# Patient Record
Sex: Female | Born: 1935 | Race: White | Hispanic: No | Marital: Married | Smoking: Never smoker
Health system: Southern US, Community
[De-identification: ages and names within clinical notes are randomized; demographics above are authoritative.]

## PROBLEM LIST (undated history)

## (undated) DIAGNOSIS — I6529 Occlusion and stenosis of unspecified carotid artery: Secondary | ICD-10-CM

## (undated) DIAGNOSIS — I4891 Unspecified atrial fibrillation: Secondary | ICD-10-CM

## (undated) DIAGNOSIS — I10 Essential (primary) hypertension: Secondary | ICD-10-CM

## (undated) DIAGNOSIS — I639 Cerebral infarction, unspecified: Secondary | ICD-10-CM

## (undated) DIAGNOSIS — E079 Disorder of thyroid, unspecified: Secondary | ICD-10-CM

## (undated) DIAGNOSIS — E785 Hyperlipidemia, unspecified: Secondary | ICD-10-CM

## (undated) HISTORY — PX: CATARACT EXTRACTION, BILATERAL: SHX1313

## (undated) HISTORY — PX: APPENDECTOMY: SHX54

## (undated) HISTORY — PX: TUBAL LIGATION: SHX77

## (undated) HISTORY — PX: OTHER SURGICAL HISTORY: SHX169

## (undated) HISTORY — DX: Occlusion and stenosis of unspecified carotid artery: I65.29

## (undated) HISTORY — DX: Cerebral infarction, unspecified: I63.9

## (undated) HISTORY — DX: Unspecified atrial fibrillation: I48.91

## (undated) HISTORY — PX: ABDOMINAL HYSTERECTOMY: SHX81

## (undated) HISTORY — DX: Hyperlipidemia, unspecified: E78.5

## (undated) HISTORY — PX: CARPAL TUNNEL RELEASE: SHX101

## (undated) HISTORY — DX: Disorder of thyroid, unspecified: E07.9

---

## 1999-05-19 ENCOUNTER — Encounter: Payer: Self-pay | Admitting: Gynecology

## 1999-05-19 ENCOUNTER — Encounter: Admission: RE | Admit: 1999-05-19 | Discharge: 1999-05-19 | Payer: Self-pay | Admitting: Gynecology

## 2000-03-25 ENCOUNTER — Other Ambulatory Visit: Admission: RE | Admit: 2000-03-25 | Discharge: 2000-03-25 | Payer: Self-pay | Admitting: Gynecology

## 2000-05-20 ENCOUNTER — Encounter: Payer: Self-pay | Admitting: Gynecology

## 2000-05-20 ENCOUNTER — Encounter: Admission: RE | Admit: 2000-05-20 | Discharge: 2000-05-20 | Payer: Self-pay | Admitting: Gynecology

## 2001-04-04 ENCOUNTER — Other Ambulatory Visit: Admission: RE | Admit: 2001-04-04 | Discharge: 2001-04-04 | Payer: Self-pay | Admitting: Gynecology

## 2001-04-18 ENCOUNTER — Ambulatory Visit (HOSPITAL_BASED_OUTPATIENT_CLINIC_OR_DEPARTMENT_OTHER): Admission: RE | Admit: 2001-04-18 | Discharge: 2001-04-18 | Payer: Self-pay | Admitting: Gynecology

## 2001-04-18 ENCOUNTER — Encounter (INDEPENDENT_AMBULATORY_CARE_PROVIDER_SITE_OTHER): Payer: Self-pay | Admitting: Specialist

## 2001-05-10 ENCOUNTER — Encounter: Admission: RE | Admit: 2001-05-10 | Discharge: 2001-05-10 | Payer: Self-pay | Admitting: Gynecology

## 2001-05-10 ENCOUNTER — Encounter: Payer: Self-pay | Admitting: Gynecology

## 2001-05-12 ENCOUNTER — Encounter: Admission: RE | Admit: 2001-05-12 | Discharge: 2001-05-12 | Payer: Self-pay | Admitting: Gynecology

## 2001-05-12 ENCOUNTER — Encounter: Payer: Self-pay | Admitting: Gynecology

## 2001-11-21 ENCOUNTER — Encounter: Payer: Self-pay | Admitting: Gynecology

## 2001-11-21 ENCOUNTER — Encounter: Admission: RE | Admit: 2001-11-21 | Discharge: 2001-11-21 | Payer: Self-pay | Admitting: Gynecology

## 2002-03-28 ENCOUNTER — Encounter: Admission: RE | Admit: 2002-03-28 | Discharge: 2002-03-28 | Payer: Self-pay | Admitting: Cardiology

## 2002-03-28 ENCOUNTER — Encounter: Payer: Self-pay | Admitting: Cardiology

## 2002-05-17 ENCOUNTER — Encounter: Admission: RE | Admit: 2002-05-17 | Discharge: 2002-05-17 | Payer: Self-pay | Admitting: Gynecology

## 2002-05-17 ENCOUNTER — Encounter: Payer: Self-pay | Admitting: Gynecology

## 2002-07-19 ENCOUNTER — Observation Stay (HOSPITAL_COMMUNITY): Admission: RE | Admit: 2002-07-19 | Discharge: 2002-07-20 | Payer: Self-pay | Admitting: Gynecology

## 2002-07-19 ENCOUNTER — Encounter (INDEPENDENT_AMBULATORY_CARE_PROVIDER_SITE_OTHER): Payer: Self-pay

## 2002-08-04 ENCOUNTER — Emergency Department (HOSPITAL_COMMUNITY): Admission: AD | Admit: 2002-08-04 | Discharge: 2002-08-04 | Payer: Self-pay | Admitting: *Deleted

## 2002-08-23 ENCOUNTER — Encounter (INDEPENDENT_AMBULATORY_CARE_PROVIDER_SITE_OTHER): Payer: Self-pay | Admitting: Cardiology

## 2002-08-23 ENCOUNTER — Ambulatory Visit (HOSPITAL_COMMUNITY): Admission: RE | Admit: 2002-08-23 | Discharge: 2002-08-23 | Payer: Self-pay | Admitting: Cardiology

## 2003-03-26 ENCOUNTER — Encounter: Admission: RE | Admit: 2003-03-26 | Discharge: 2003-03-26 | Payer: Self-pay | Admitting: Urology

## 2003-03-29 ENCOUNTER — Ambulatory Visit (HOSPITAL_BASED_OUTPATIENT_CLINIC_OR_DEPARTMENT_OTHER): Admission: RE | Admit: 2003-03-29 | Discharge: 2003-03-29 | Payer: Self-pay | Admitting: Urology

## 2003-03-29 ENCOUNTER — Ambulatory Visit (HOSPITAL_COMMUNITY): Admission: RE | Admit: 2003-03-29 | Discharge: 2003-03-29 | Payer: Self-pay | Admitting: Urology

## 2003-05-31 ENCOUNTER — Encounter: Admission: RE | Admit: 2003-05-31 | Discharge: 2003-05-31 | Payer: Self-pay | Admitting: Gynecology

## 2003-05-31 ENCOUNTER — Other Ambulatory Visit: Admission: RE | Admit: 2003-05-31 | Discharge: 2003-05-31 | Payer: Self-pay | Admitting: Gynecology

## 2003-07-18 ENCOUNTER — Ambulatory Visit (HOSPITAL_COMMUNITY): Admission: RE | Admit: 2003-07-18 | Discharge: 2003-07-18 | Payer: Self-pay | Admitting: Urology

## 2004-06-02 ENCOUNTER — Encounter: Admission: RE | Admit: 2004-06-02 | Discharge: 2004-06-02 | Payer: Self-pay | Admitting: Gynecology

## 2004-10-27 ENCOUNTER — Encounter: Admission: RE | Admit: 2004-10-27 | Discharge: 2004-10-27 | Payer: Self-pay | Admitting: Internal Medicine

## 2005-06-05 ENCOUNTER — Encounter: Admission: RE | Admit: 2005-06-05 | Discharge: 2005-06-05 | Payer: Self-pay | Admitting: Gynecology

## 2005-06-05 ENCOUNTER — Other Ambulatory Visit: Admission: RE | Admit: 2005-06-05 | Discharge: 2005-06-05 | Payer: Self-pay | Admitting: Gynecology

## 2006-06-08 ENCOUNTER — Encounter: Admission: RE | Admit: 2006-06-08 | Discharge: 2006-06-08 | Payer: Self-pay | Admitting: Gynecology

## 2006-06-08 ENCOUNTER — Other Ambulatory Visit: Admission: RE | Admit: 2006-06-08 | Discharge: 2006-06-08 | Payer: Self-pay | Admitting: Gynecology

## 2006-06-09 ENCOUNTER — Ambulatory Visit: Payer: Self-pay | Admitting: Vascular Surgery

## 2007-03-23 ENCOUNTER — Ambulatory Visit (HOSPITAL_COMMUNITY): Admission: RE | Admit: 2007-03-23 | Discharge: 2007-03-23 | Payer: Self-pay | Admitting: Ophthalmology

## 2007-06-09 ENCOUNTER — Encounter: Admission: RE | Admit: 2007-06-09 | Discharge: 2007-06-09 | Payer: Self-pay | Admitting: Gynecology

## 2007-11-22 ENCOUNTER — Ambulatory Visit: Payer: Self-pay | Admitting: Vascular Surgery

## 2008-06-11 ENCOUNTER — Encounter: Admission: RE | Admit: 2008-06-11 | Discharge: 2008-06-11 | Payer: Self-pay | Admitting: Gynecology

## 2009-05-12 ENCOUNTER — Encounter: Admission: RE | Admit: 2009-05-12 | Discharge: 2009-05-12 | Payer: Self-pay | Admitting: Neurology

## 2009-06-07 ENCOUNTER — Ambulatory Visit: Payer: Self-pay | Admitting: Vascular Surgery

## 2009-06-17 ENCOUNTER — Encounter: Admission: RE | Admit: 2009-06-17 | Discharge: 2009-06-17 | Payer: Self-pay | Admitting: Gynecology

## 2010-03-01 ENCOUNTER — Encounter: Payer: Self-pay | Admitting: Internal Medicine

## 2010-05-21 ENCOUNTER — Other Ambulatory Visit (INDEPENDENT_AMBULATORY_CARE_PROVIDER_SITE_OTHER): Payer: Medicare Other

## 2010-05-21 ENCOUNTER — Other Ambulatory Visit: Payer: Self-pay

## 2010-05-21 DIAGNOSIS — I6529 Occlusion and stenosis of unspecified carotid artery: Secondary | ICD-10-CM

## 2010-05-27 NOTE — Procedures (Unsigned)
CAROTID DUPLEX EXAM  INDICATION:  Carotid stenosis.  HISTORY: Diabetes:  No. Cardiac:  A fib. Hypertension:  Yes. Smoking:  No. Previous Surgery:  No. CV History:  TIA on 04/28/2009. Amaurosis Fugax No, Paresthesias No, Hemiparesis No.                                      RIGHT             LEFT Brachial systolic pressure:         130               124 Brachial Doppler waveforms:         Normal            Normal Vertebral direction of flow:        Antegrade         Antegrade DUPLEX VELOCITIES (cm/sec) CCA peak systolic                   74                67 ECA peak systolic                   47                49 ICA peak systolic                   P = 49/D = 121    P = 55/D = 121 ICA end diastolic                   P = 17/D = 50     P = 16/D = 45 PLAQUE MORPHOLOGY:                  Heterogenous PLAQUE AMOUNT:                      Mild              None PLAQUE LOCATION:                    ECA,  IMPRESSION: 1. Elevated velocities noted in the bilateral distal internal carotid     arteries, which appear to be due to vessel tortuosity and changing. 2. No hemodynamically significant stenosis of the bilateral proximal     internal carotid arteries. 3. Doppler velocities of the left distal internal carotid artery     appear less than previously recorded when compared to the previous     examination on 06/07/2009 with the right distal internal carotid     artery remaining stable.  ___________________________________________ Quita Skye. Hart Rochester, M.D.  CH/MEDQ  D:  05/21/2010  T:  05/21/2010  Job:  [id number]

## 2010-05-28 ENCOUNTER — Other Ambulatory Visit: Payer: Self-pay | Admitting: Gynecology

## 2010-05-28 DIAGNOSIS — Z1231 Encounter for screening mammogram for malignant neoplasm of breast: Secondary | ICD-10-CM

## 2010-06-24 ENCOUNTER — Other Ambulatory Visit: Payer: Self-pay | Admitting: Gynecology

## 2010-06-24 ENCOUNTER — Ambulatory Visit: Payer: Medicare Other

## 2010-06-24 ENCOUNTER — Ambulatory Visit
Admission: RE | Admit: 2010-06-24 | Discharge: 2010-06-24 | Disposition: A | Discharge status: Home / Self Care | Payer: Medicare Other | Source: Ambulatory Visit | Attending: Gynecology | Admitting: Gynecology

## 2010-06-24 DIAGNOSIS — Z1231 Encounter for screening mammogram for malignant neoplasm of breast: Secondary | ICD-10-CM

## 2010-06-24 NOTE — Procedures (Signed)
CAROTID DUPLEX EXAM   INDICATION:  Bilateral carotid bruits.   HISTORY:  Diabetes:  No.  Cardiac:  Atrial fibrillation.  Hypertension:  Yes.  Smoking:  No.  Previous Surgery:  No.  CV History:  TIA on 04/28/2009.  Amaurosis Fugax No, Paresthesias No, Hemiparesis No                                       RIGHT             LEFT  Brachial systolic pressure:         120               120  Brachial Doppler waveforms:         Triphasic         Triphasic  Vertebral direction of flow:        Antegrade         Antegrade  DUPLEX VELOCITIES (cm/sec)  CCA peak systolic                   73                93  ECA peak systolic                   105               93  ICA peak systolic                   146 distal portion                  255 distal portion  ICA end diastolic                   56                94  PLAQUE MORPHOLOGY:                  Homogeneous       Homogeneous  PLAQUE AMOUNT:                      Mild              Mild  PLAQUE LOCATION:                    Mid to distal ICA Mid to distal ICA   IMPRESSION:  1. The right distal internal carotid artery measures 146 peak      systolic, 56 end diastolic cm/s.  The distal left internal carotid      artery measures 255 cm/s peak systolic, 94 cm/s end diastolic in an      area that appears to have a kink.  There is no obvious visualized      plaque in the carotid system but the mid to distal internal carotid      arteries bilaterally are very tortuous.  This may be due to the      increase in velocity.  2. The velocities in the proximal internal carotid artery show 20%-39%      stenosis.  The proximal internal carotid artery on the right side      is 64 cm/s systolic, 29 cm/s diastolic and on the left side the      peak systolic in the proximal internal  carotid artery is 83 cm/s in      the systolic and 23 cm/s in end diastolic.  3. Bilateral vertebral antegrade flow.       ___________________________________________  Di Kindle. Edilia Bo, M.D.   NT/MEDQ  D:  06/07/2009  T:  06/07/2009  Job:  [id number]

## 2010-06-24 NOTE — Procedures (Signed)
CAROTID DUPLEX EXAM   INDICATION:  Bilateral carotid bruit.   HISTORY:  Diabetes:  No.  Cardiac:  Atrial fibrillation.  Hypertension:  No.  Smoking:  No.  Previous Surgery:  No.  CV History:  Previous duplex revealed no ICA stenosis bilaterally with  tortuous distal ICAs.  Amaurosis Fugax No, Paresthesias No, Hemiparesis No                                       RIGHT             LEFT  Brachial systolic pressure:         140               138  Brachial Doppler waveforms:         Triphasic         Triphasic  Vertebral direction of flow:        Antegrade         Antegrade  DUPLEX VELOCITIES (cm/sec)  CCA peak systolic                   61                79  ECA peak systolic                   81                87  ICA peak systolic                   77                68  ICA end diastolic                   30                28  PLAQUE MORPHOLOGY:                  Soft              None  PLAQUE AMOUNT:                      Mild              None  PLAQUE LOCATION:                    Distal ICA        None   IMPRESSION:  1. Distal right ICA 134 peak systolic cm/second, 57 cm/second end      diastolic.  Distal left internal carotid artery 128 cm/second peak      systolic velocity, 43 cm/second end diastolic velocity.  2. Distal left ICA is tortuous.  3. 20-39% distal right ICA stenosis.  4. No left ICA stenosis.   ___________________________________________  Di Kindle. Edilia Bo, M.D.   MC/MEDQ  D:  11/22/2007  T:  11/22/2007  Job:  [id number]

## 2010-06-24 NOTE — Op Note (Signed)
NAME:  Tammy Wolf, Tammy Wolf NO.:  [id number]   MEDICAL RECORD NO.:  [id number]          PATIENT TYPE:  AMB   LOCATION:  SDS                          FACILITY:  [facility]   PHYSICIAN:  Alford Highland. Rankin, M.D.   DATE OF BIRTH:  1932/09/15   DATE OF PROCEDURE:  03/23/2007  DATE OF DISCHARGE:                               OPERATIVE REPORT   PREOPERATIVE DIAGNOSIS:  Retained lens fragments, OD.   POSTOPERATIVE DIAGNOSIS:  Retained lens fragments, OD.   PROCEDURE:  Posterior vitrectomy with 25 gauge, OD.   SURGEON:  Alford Highland. Rankin, M.D.   ANESTHESIA:  Local retrobulbar and monitored anesthesia care.   INDICATIONS FOR PROCEDURE:  The patient is a 77 year old woman who has  retained lens fragments and multiple floaters impairing her vision,  activities of daily living, as well as secondary elevation of  intraocular pressure of glaucoma, on the basis of retained dispersed  lens fragments in the posterior cavity.  The patient understands this is  an attempt to remove from the vitreous the retained lens fragments so as  to prevent and to slow and to retard and to decrease the intraocular  pressure and retard progression of secondary glaucoma.  She understands  the risks of anesthesia including the rare occurrence of death and also  to the eye including but not limited to hemorrhage, infection, scarring,  need for further surgery, no change in vision, loss of vision,  progression of disease despite intervention.  Appropriate signed consent  was obtained.   DESCRIPTION OF PROCEDURE:  The patient was taken to the operating room.  In the operating room, appropriate monitors were followed by mild  sedation.  2% Xylocaine was used for retrobulbar and additional 5 mL in  the fashion of modified Darel Hong.  The right periocular region was  sterilely prepped and draped in the usual ophthalmic fashion.  A lid  speculum was applied.  A 25 gauge trocar was placed in the  inferotemporal  quadrant.  Superior trocar was applied.  The infusion was  turned on.  A core vitrectomy was then begun using 25 gauge instruments.  Multiple lens fragments, mostly cortical and many perinuclear fragments,  were dispersed and these were removed.  Scleral depression was used  inferiorly and peripherally to confirm no occult lens fragments in the  vitreous base.  Fluid air exchange was also done temporarily so as to  remove any retroiris lens fragments.  None were found.  All fragments  visualized were removed.  Thereafter, an air fluid exchange was placed  and fluid left in  the eye.  The superior trocar was removed from the eye.  The infusion  was removed.  Subconjunctival Decadron applied.  A sterile patch and Fox  shield applied.  The patient tolerated the procedure well without  complication.  She was taken to the short stay area and discharged home  as an outpatient.      Alford Highland Rankin, M.D.  Electronically Signed     GAR/MEDQ  D:  03/23/2007  T:  03/24/2007  Job:  [id number]   cc:  Chucky May, M.D.

## 2010-06-24 NOTE — Assessment & Plan Note (Signed)
OFFICE VISIT   ADANA, MARIK  DOB:  11-13-1932                                       11/22/2007  ZOXWR#:[id number]   I saw the patient in the office today for continued followup of her  carotid disease.  This is a pleasant 77 year old woman who I have been  following with some mild carotid plaque.  She requests that we follow  intermittently and I have been seeing her at 40-month intervals.  Since  I saw her last in April 2008 she has had no history of stroke, TIAs,  expressive or receptive aphasia, or amaurosis fugax.  She remains on 81  mg of aspirin a day.   REVIEW OF SYSTEMS:  She had no recent chest pain, chest pressure,  palpitations or arrhythmias.  She had no bronchitis, asthma or wheezing.   PHYSICAL EXAMINATION:  Blood pressure is 130/74, heart rate is 64.  Neck  is supple.  I do not detect any carotid bruits.  Lungs are clear  bilaterally to auscultation.  Cardiac exam she has a regular rate and  rhythm.  Neurologic exam is nonfocal.   Carotid duplex scan shows a less than 39% carotid stenosis bilaterally.  She does have some tortuosity to her distal left internal carotid  artery.  Overall, I have explained her carotid disease has remained  stable.  She understands we would not consider carotid endarterectomy  unless the stenosis progressed to greater than 80% or she developed new  neurologic symptoms.  I plan on seeing her back in 18 months.  She knows  to call sooner if she has problems.  In the meantime she knows to  continue taking her aspirin.   Di Kindle. Edilia Bo, M.D.  Electronically Signed   CSD/MEDQ  D:  11/22/2007  T:  11/23/2007  Job:  [id number]

## 2010-06-27 NOTE — Op Note (Signed)
NAME:  Tammy Wolf, Tammy Wolf                        ACCOUNT NO.:  [id number]   MEDICAL RECORD NO.:  [id number]                   PATIENT TYPE:  OBV   LOCATION:  9313                                 FACILITY:  [facility]   PHYSICIAN:  Gretta Cool, M.D.              DATE OF BIRTH:  [birth year]   DATE OF PROCEDURE:  07/19/2002  DATE OF DISCHARGE:                                 OPERATIVE REPORT   PREOPERATIVE DIAGNOSES:  Abnormal uterine bleeding, recurrent after  hysteroscopy resection ablation for endometrial polyps with apparent  recurrent polyp on ultrasound.   POSTOPERATIVE DIAGNOSES:  Abnormal uterine bleeding, recurrent after  hysteroscopy resection ablation for endometrial polyps with apparent  recurrent polyp on ultrasound.   PROCEDURES:  1. Vaginal hysterectomy.  2. Left salpingo-oophorectomy.  3. Right salpingectomy.   SURGEON:  Gretta Cool, M.D.   ASSISTANT:  Raynald Kemp, M.D.   ANESTHESIA:  General with orotracheal.   DESCRIPTION OF PROCEDURE:  Under excellent anesthesia as above with patient  prepped and draped in lithotomy position, Allen stirrups, with the bladder  drained, a weighted speculum was placed in the vagina and the cervix grasped  with single-tooth tenaculum.  The mucosa was then circumcised, and pushed  off the lower uterine segment.  The cul-de-sac was then entered and the  uterosacrals clamped with Heaney clamps, cut, sutured, and tied with 0  Vicryl.  The cardinals were then progressively clamped, cut, sutured, and  tied with 0 Vicryl.  The vesicovaginal plica was then identified, opened,  the perineum opened, and a Deaver placed beneath the bladder.  The uterine  vessels were then clamped, cut, sutured, and tied with 0 Vicryl.  The uterus  was then inverted and the adnexal pedicles clamped across.  The uterus was  then excised.  At this point, the left ovary and tube were removed with  moderate difficulty with the clamps placed above, beyond the  ovary on the  infundibulopelvic ligament.  The pedicles were then tied with free tie of 0  Vicryl and then sutured with 0 Vicryl.  On the right, the ovary could not be  identified for certain.  The fallopian tube and the mesosalpinx was  delivered through the incision and the pedicle clamped as high as possible.  Initially the ovary was thought to be part of this specimen but could not  later be identified.  At this point, the pedicles were also ligated as  before for the left tube an ovary.  At this point, attention was turned to  closure for the support of the cuff.  The cuff was supported at level 1 by  uterosacral cardinal colposuspension using 2-0 Ethibond.  At this point, the  peritoneum was purstringed from anterior peritoneum lateral pedicles to  posterior cul-de-sac.  Peritoneum was then tied tight.  The cuff was then  secured with a running suture of  0 Vicryl from the right  angle to the left.  At the end of the procedure, the  sponge and lap counts were correct.  There were no complications.  Estimated  blood loss was probably less than 100 ml.  Complications none.  Patient  returned to the recovery room in excellent condition.                                               Gretta Cool, M.D.    CWL/MEDQ  D:  07/19/2002  T:  07/19/2002  Job:  [id number]   cc:   Raynelle Jan, M.D.  [address] [place], [place] [address]  Fax: [phone]

## 2010-06-27 NOTE — H&P (Signed)
NAME:  Tammy Wolf, Tammy Wolf                        ACCOUNT NO.:  [id number]   MEDICAL RECORD NO.:  [id number]                   PATIENT TYPE:  AMB   LOCATION:  SDC                                  FACILITY:  [facility]   PHYSICIAN:  Gretta Cool, M.D.              DATE OF BIRTH:  Nov 21, 1932   DATE OF ADMISSION:  DATE OF DISCHARGE:                                HISTORY & PHYSICAL   CHIEF COMPLAINT:  Recurrent endometrial polyp and abnormal uterine bleeding.   HISTORY OF PRESENT ILLNESS:  A 77 year old G4, P4 under the primary care of  Nadine Counts, M.D. and Raynelle Jan, M.D. at [facility],  [place].  She has a history of endometrial polyp and hysterectomy resection  of the polyp.  Did well for six or eight months and now has recurrence of  bleeding and recurrence of an endometrial polyp on ultrasound.  The  pathology at the time of the resection in March 2003 revealed large  endometrial polyp, no evidence of endometrial hyperplasia or atypia.  She  had resumption of extremely heavy bleeding in February with clots and flow  for seven days.  She has continued to have regular withdrawal bleeding.   She has a history of carotid bruits.  Has been evaluated and found to have  tortuous carotids, but no evidence of significant stenosis by CVTS.  She has  been cleared by cardiology for this procedure.   PAST MEDICAL HISTORY:  1. Usual childhood disease without sequelae.  2. 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  3. She also has hypothyroidism, autoimmune.  4. She has dyslipidemia.  5. She has previous hospitalizations for hysteroscopy ablation, left breast     biopsy, tubal ligation, and T&A as a child.   CURRENT MEDICATIONS:  1. Climara 0.5.  2. Prometrium 200 mg 1-12 each month.  3. Mobic 7.5 mg daily.  4. Synthroid 0.88 mg daily.   ALLERGIES:  None known.   FAMILY HISTORY:  Father had MI age 110.  Mother died of leukemia at age 35.  She has no siblings.  No other known familial  tendency to disease.   REVIEW OF SYSTEMS:  HEENT:  Normal thyroid and carotids.  RESPIRATORY:  Denies asthma, cough, bronchitis, shortness of breath.  GASTROINTESTINAL/GENITOURINARY:  Denies frequency, urgency, dysuria, change  in bowel habits, food intolerance.   PHYSICAL EXAMINATION:  GENERAL:  Well-developed, well-nourished, thin short  white female down 3 inches in height from her baseline.  VITAL SIGNS:  She has significant blood pressure elevation to 160/80 and  this is under evaluation by her internist.  HEENT:  Pupils equal, react to light and accommodate.  Fundi not examined.  Oropharynx clear.  NECK:  Supple without mass, thyroid enlargement.  CHEST:  Clear P to A.  HEART:  Regular rhythm without murmur or cardiac enlargement.  BREASTS:  Soft without mass,  nodes, nipple discharge.  PELVIC:  External genitalia:  Normal female.  Vagina clean, rugose.  Adequate estrogen effect.  Cervix is parous, clean.  Uterus normal size,  shape, contour.  Adnexa clear.  Rectovaginal confirms.   IMPRESSION:  1. Normal gynecologic examination with recurrent endometrial polyp by     ultrasound and abnormal uterine bleeding.  2. Carotid bruit with tortuous carotids bilaterally evaluated by CVTS.  3. Height loss with suspicion of compression fractures.  4. Arthritis.  5. Hypothyroid on replacement.   PLAN:  On to vaginal hysterectomy and salpingo-oophorectomy if it is  reasonably possible.  She understands the risks, benefits all and this is  her choice.  I have explained lesser choices, all.                                               Gretta Cool, M.D.    CWL/MEDQ  D:  07/19/2002  T:  07/19/2002  Job:  [id number]

## 2010-06-27 NOTE — Consult Note (Signed)
NAME:  Tammy Wolf, Tammy Wolf NO.:  [id number]   MEDICAL RECORD NO.:  [id number]                   PATIENT TYPE:  EMS   LOCATION:  MINO                                 FACILITY:  [facility]   PHYSICIAN:  W. Ashley Royalty., M.D.         DATE OF BIRTH:  11-Apr-1932   DATE OF CONSULTATION:  08/04/2002  DATE OF DISCHARGE:  08/04/2002                                   CONSULTATION   HISTORY OF PRESENT ILLNESS:  This 77 year old female is sent at the request  of Dr. Carolyne Fiscal for evaluation of atrial fibrillation. The patient has a prior  history of palpitations that started in 1993 after  she was diagnosed with  thyroid disease and was placed on Synthroid. She has had concern over family  history in the past and has been evaluated previously with an echocardiogram  and last year because of an abnormal EKG prior to surgery had a negative  stress Cardiolite study on August 25, 2001.   She was seen earlier this year with some concern over some atypical back  pain which she had seen her chiropractor for. At the time an EKG was normal  and the pain did not really sound cardiac  in nature. She has an anxiety  disorder and is quite anxious about her overall health. She also had carotid  bruits noted and was recently evaluated by the vascular surgeon and was not  found to have significant carotid artery disease but rather had tortuous  carotid arteries. Two weeks ago she had a hysterectomy  and tolerated  this  well and had been doing well since then.   She had the sudden onset of palpitations following lunch today, described as  a fluttering associated with some dizziness and some blurred vision. She had  laid down, did not get any better, and went to see Dr. Carolyne Fiscal where she was  found to be in rapid atrial fibrillation. She was given 25 mg of Toprol and  sent to the [facility] emergency room. She is feeling better now  and was in sinus rhythm on arrival here.   She  has never been diagnosed with atrial fibrillation previously. She has no  chest pain suggestive of angina and normally has good exercise tolerance  without symptoms of heart failure or other  medical problems.   PAST MEDICAL HISTORY:  Remarkable for hypothyroidism and a history of  anxiety. She has never been diagnosed with hypertension before but has had  some borderline elevations of systolic blood pressure intermittently. She  was normotensive following her surgery.   PAST SURGICAL HISTORY:  1. Appendectomy.  2. Tonsillectomy.  3. Tubal ligation.  4. Hysterectomy.  5. Carpal tunnel release.   ALLERGIES:  None.   CURRENT MEDICATIONS:  1. Aspirin 81 mg daily.  2. Mobic 7.5 daily.  3. Several multivitamin preparations.  4. Vivol patch twice weekly.  5. Synthroid 0.088 mg daily.  FAMILY HISTORY:  Her father died age 53 of an MI. Her mother died age at age  81 of leukemia.   SOCIAL HISTORY:  She is married and lives with her husband. She has never  smoked. 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   REVIEW OF SYSTEMS:  Otherwise unremarkable as noted above.   PHYSICAL EXAMINATION:  GENERAL:  She is a pleasant, anxious appearing female  in no acute distress.  VITAL SIGNS:  Blood pressure is currently 140/80, pulse currently 80 and  regular.  SKIN:  Warm and dry.  HEENT:  Unremarkable.  NECK:  Soft bilateral carotid bruits.  LUNGS:  Clear.  CARDIAC:  Normal S1 and S2, no S3 or murmur.  ABDOMEN:  Soft, nontender, no masses.  EXTREMITIES:  Peripheral pulses 2+.   A 12-lead EKG shows nonspecific changes laterally.   IMPRESSION:  1. Paroxysmal atrial fibrillation, resolved.  2. Anxiety.  3. Recent gynecologic surgery.   RECOMMENDATIONS:  She may go home tonight on Toprol XL 50 mg in the morning  and 25 mg in the evening. I will see her next Tuesday and we will arrange an  outpatient workup for her atrial fibrillation. She may take aspirin  for   anticoagulation at the present time. She should call us if she develops  recurrent palpitations.                                               Darden Palmer., M.D.    WST/MEDQ  D:  08/04/2002  T:  08/04/2002  Job:  [id number]   cc:   Raynelle Jan, M.D.  [address] [place], [place] [address]  Fax: [phone]

## 2010-06-27 NOTE — Op Note (Signed)
NAME:  Tammy Wolf, Tammy Wolf                        ACCOUNT NO.:  [id number]   MEDICAL RECORD NO.:  [id number]                   PATIENT TYPE:  AMB   LOCATION:  NESC                                 FACILITY:  [facility]   PHYSICIAN:  Excell Seltzer. Annabell Howells, M.D.                 DATE OF BIRTH:  01/29/1933   DATE OF PROCEDURE:  03/29/2003  DATE OF DISCHARGE:                                 OPERATIVE REPORT   PROCEDURE:  SPARC sling procedure.   PREOPERATIVE DIAGNOSES:  Stress incontinence.   POSTOPERATIVE DIAGNOSES:  Stress incontinence.   SURGEON:  Excell Seltzer. Annabell Howells, M.D.   ANESTHESIA:  General.   COMPLICATIONS:  None.   INDICATIONS FOR PROCEDURE:  Tammy Wolf is a 77 year old white female with a  history of stress incontinence whose elected a SPARC sling for treatment.   FINDINGS AND PROCEDURE:  She was taken to the operating room where she  received p.o. antibiotics preoperatively. A general anesthetic was induced,  she was placed in lithotomy position.  Her mons was shaved, she was prepped  with Betadine solution and draped in the usual sterile fashion. A Foley  catheter was inserted, the bladder was drained. A weighted vaginal retractor  was placed, the anterior vaginal wall was infiltrated with 8 mL of 1%  lidocaine with epinephrine.  Two incisions were made over the pubis, one to  the right and one to the left of midline approximately 2 cm. The fat was  spread to the fascia. An incision was then made over the mid urethral level  vaginally.  The mucosa was elevated off the pubourethral fascia for  approximately 2 cm on each side. The [facility] trocars were then passed first on  the right. The tip was brought down to the pubic bone, walked along the back  of the pubic bone and then guided with a finger in the vaginal incision out  through the vaginal incision and this was repeated on the left. Cystoscopy  was then performed using a 21 [place] scope and the 70 degree lenses.  Examination revealed no  evidence of needle injury to the bladder neck.  The  space mesh was then snapped to the trocars and drawn back into the abdominal  incision. Cystoscopy was then repeated.  Once again no injury was noted, the  bladder was left full at this time, the scope was removed.  The [facility] sheath  material was removed, the tension of the mesh was adjusted until pressure on  the bladder produced persistent flow.  Once the tension was felt to be  appropriate, a Foley catheter was reinserted and a right angled clamp was  placed between the mesh and once the tension was felt to be appropriate, a  Foley catheter was reinserted and a right angled clamp was placed between  the mesh and urethra. The gap was felt to be appropriate.  At this point,  the anterior vaginal  wall was closed using a running locked 2-0 Vicryl. The  abdominal ends of the sling were then trimmed to allow retraction back into  the subcutaneous space. The abdominal incisions were cleaned, dried with  tincture of Benzoin and closed with Steri-Strips. A 2 inch  iodoform vaginal pack was placed, the catheter was placed to drainage. The  patient was taken down from lithotomy position, her anesthetic was reversed  and she was moved to the recovery room in stable condition.  There were no  complications.                                               Excell Seltzer. Annabell Howells, M.D.    JJW/MEDQ  D:  03/29/2003  T:  03/29/2003  Job:  [id number]

## 2010-06-27 NOTE — Op Note (Signed)
[facility]  Patient:    Tammy Wolf, Tammy Wolf Visit Number: [id number] MRN: [id number]          Service Type: NES Location: NESC Attending Physician:  Katrina Stack Dictated by:   Gretta Cool, M.D. Proc. Date: 04/18/01 Admit Date:  04/18/2001   CC:         Nadine Counts, M.D.   Operative Report  PREOPERATIVE DIAGNOSIS:  Endometrial polyp with abnormal uterine bleeding.  POSTOPERATIVE DIAGNOSIS:  Endometrial polyp with abnormal uterine bleeding.  OPERATION:  Hysteroscopy, D&C, resection of endometrial polyp, total endometrial resection for ablation plus VaporTrode.  SURGEON:  Gretta Cool, M.D.  ANESTHESIA:  IV sedation, paracervical block.  DESCRIPTION OF PROCEDURE:  Under excellent anesthesia, as above, with the patient prepped and draped in lithotomy position and her bladder drained, the cervix was grasped with a single tooth tenaculum and pulled down into view. The cervix was then progressively dilated with a series of Pratt dilators to accommodate a 7 mm resectoscope.  The resectoscope was then introduced and the uterine cavity visualized.  A sizable polyp was noted attached to the right cornual area.  It appeared well epithelialized and entirely normal.  The polyp was resected.  The entire cavity was photographed including the tubal openings, both of which appeared normal.  The remainder of the endometrial cavity was very thin, atrophic.  After the polyp was resected the entire endometrial cavity was resected so as to remove any viable endometrial tissue and prevent recurrence of endometrial polyps.  At this point with the pressure reduced the bleeding was well controlled.  The procedure was then terminated without complication.Dictated by:   Gretta Cool, M.D. Attending Physician:  Katrina Stack DD:  04/18/01 TD:  04/18/01 Job: [id number]

## 2010-09-23 ENCOUNTER — Emergency Department (HOSPITAL_COMMUNITY)
Admission: EM | Admit: 2010-09-23 | Discharge: 2010-09-24 | Disposition: A | Discharge status: Home / Self Care | Payer: Medicare Other | Attending: Emergency Medicine | Admitting: Emergency Medicine

## 2010-09-23 ENCOUNTER — Emergency Department (HOSPITAL_COMMUNITY): Payer: Medicare Other

## 2010-09-23 DIAGNOSIS — I4891 Unspecified atrial fibrillation: Secondary | ICD-10-CM | POA: Insufficient documentation

## 2010-09-23 DIAGNOSIS — Z8673 Personal history of transient ischemic attack (TIA), and cerebral infarction without residual deficits: Secondary | ICD-10-CM | POA: Insufficient documentation

## 2010-09-23 DIAGNOSIS — Z79899 Other long term (current) drug therapy: Secondary | ICD-10-CM | POA: Insufficient documentation

## 2010-09-23 DIAGNOSIS — R112 Nausea with vomiting, unspecified: Secondary | ICD-10-CM | POA: Insufficient documentation

## 2010-09-23 DIAGNOSIS — I2510 Atherosclerotic heart disease of native coronary artery without angina pectoris: Secondary | ICD-10-CM | POA: Insufficient documentation

## 2010-09-23 DIAGNOSIS — M546 Pain in thoracic spine: Secondary | ICD-10-CM | POA: Insufficient documentation

## 2010-09-23 DIAGNOSIS — R1013 Epigastric pain: Secondary | ICD-10-CM | POA: Insufficient documentation

## 2010-09-23 DIAGNOSIS — I10 Essential (primary) hypertension: Secondary | ICD-10-CM | POA: Insufficient documentation

## 2010-09-23 HISTORY — DX: Essential (primary) hypertension: I10

## 2010-09-23 LAB — CBC
MCH: 33.6 pg (ref 26.0–34.0)
MCHC: 35.1 g/dL (ref 30.0–36.0)
MCV: 95.6 fL (ref 78.0–100.0)
Platelets: 295 10*3/uL (ref 150–400)
RDW: 13.6 % (ref 11.5–15.5)

## 2010-09-23 LAB — DIFFERENTIAL
Eosinophils Absolute: 0.2 10*3/uL (ref 0.0–0.7)
Eosinophils Relative: 1 % (ref 0–5)
Lymphs Abs: 2.9 10*3/uL (ref 0.7–4.0)
Monocytes Absolute: 1.6 10*3/uL — ABNORMAL HIGH (ref 0.1–1.0)
Monocytes Relative: 10 % (ref 3–12)

## 2010-09-23 LAB — COMPREHENSIVE METABOLIC PANEL
Alkaline Phosphatase: 47 U/L (ref 39–117)
BUN: 23 mg/dL (ref 6–23)
Calcium: 10.2 mg/dL (ref 8.4–10.5)
GFR calc Af Amer: >60 mL/min (ref >60)
Glucose, Bld: 107 mg/dL — ABNORMAL HIGH (ref 70–99)
Potassium: 2.9 mEq/L — ABNORMAL LOW (ref 3.5–5.1)
Total Protein: 6.9 g/dL (ref 6.0–8.3)

## 2010-09-23 LAB — LIPASE, BLOOD: Lipase: 37 U/L (ref 11–59)

## 2010-09-24 ENCOUNTER — Emergency Department (HOSPITAL_COMMUNITY): Payer: Medicare Other

## 2010-09-24 ENCOUNTER — Encounter (HOSPITAL_COMMUNITY): Payer: Self-pay | Admitting: Radiology

## 2010-09-24 LAB — CBC
HCT: 34.4 % — ABNORMAL LOW (ref 36.0–46.0)
Hemoglobin: 11.6 g/dL — ABNORMAL LOW (ref 12.0–15.0)
MCH: 32.2 pg (ref 26.0–34.0)
MCV: 95.6 fL (ref 78.0–100.0)
RBC: 3.6 MIL/uL — ABNORMAL LOW (ref 3.87–5.11)
WBC: 10.8 10*3/uL — ABNORMAL HIGH (ref 4.0–10.5)

## 2010-09-24 LAB — DIFFERENTIAL
Eosinophils Absolute: 0.2 10*3/uL (ref 0.0–0.7)
Lymphocytes Relative: 27 % (ref 12–46)
Lymphs Abs: 2.9 10*3/uL (ref 0.7–4.0)
Monocytes Relative: 9 % (ref 3–12)
Neutrophils Relative %: 61 % (ref 43–77)

## 2010-09-24 LAB — URINALYSIS, ROUTINE W REFLEX MICROSCOPIC
Glucose, UA: NEGATIVE mg/dL
Hgb urine dipstick: NEGATIVE
Ketones, ur: NEGATIVE mg/dL
Leukocytes, UA: NEGATIVE
Protein, ur: NEGATIVE mg/dL

## 2010-09-24 MED ORDER — IOHEXOL 300 MG/ML  SOLN: 100.0000 mL | Freq: Once | INTRAMUSCULAR | Status: DC | PRN

## 2010-10-31 LAB — CBC
HCT: 37.9
Hemoglobin: 12.9
MCV: 100.1 — ABNORMAL HIGH
RDW: 13.2

## 2010-10-31 LAB — BASIC METABOLIC PANEL
BUN: 19
CO2: 28
Chloride: 101
GFR calc non Af Amer: 48 — ABNORMAL LOW
Glucose, Bld: 88
Potassium: 3.5
Sodium: 139

## 2010-10-31 LAB — APTT: aPTT: 26

## 2011-04-20 DIAGNOSIS — I639 Cerebral infarction, unspecified: Secondary | ICD-10-CM

## 2011-04-20 HISTORY — DX: Cerebral infarction, unspecified: I63.9

## 2011-05-04 ENCOUNTER — Other Ambulatory Visit: Payer: Self-pay | Admitting: Neurology

## 2011-05-04 DIAGNOSIS — F801 Expressive language disorder: Secondary | ICD-10-CM

## 2011-05-04 DIAGNOSIS — H47649 Disorders of visual cortex in (due to) vascular disorders, unspecified side of brain: Secondary | ICD-10-CM

## 2011-05-04 DIAGNOSIS — G459 Transient cerebral ischemic attack, unspecified: Secondary | ICD-10-CM

## 2011-05-07 ENCOUNTER — Ambulatory Visit
Admission: RE | Admit: 2011-05-07 | Discharge: 2011-05-07 | Disposition: A | Discharge status: Home / Self Care | Payer: Medicare Other | Source: Ambulatory Visit | Attending: Neurology | Admitting: Neurology

## 2011-05-07 DIAGNOSIS — H47649 Disorders of visual cortex in (due to) vascular disorders, unspecified side of brain: Secondary | ICD-10-CM

## 2011-05-07 DIAGNOSIS — F801 Expressive language disorder: Secondary | ICD-10-CM

## 2011-05-07 DIAGNOSIS — G459 Transient cerebral ischemic attack, unspecified: Secondary | ICD-10-CM

## 2011-11-19 ENCOUNTER — Other Ambulatory Visit: Payer: Self-pay | Admitting: *Deleted

## 2011-11-19 DIAGNOSIS — I6529 Occlusion and stenosis of unspecified carotid artery: Secondary | ICD-10-CM

## 2011-11-19 DIAGNOSIS — Z8673 Personal history of transient ischemic attack (TIA), and cerebral infarction without residual deficits: Secondary | ICD-10-CM

## 2011-11-24 ENCOUNTER — Encounter: Payer: Self-pay | Admitting: Vascular Surgery

## 2011-11-25 ENCOUNTER — Ambulatory Visit (INDEPENDENT_AMBULATORY_CARE_PROVIDER_SITE_OTHER): Payer: Medicare Other | Admitting: Vascular Surgery

## 2011-11-25 ENCOUNTER — Encounter: Payer: Self-pay | Admitting: Vascular Surgery

## 2011-11-25 ENCOUNTER — Other Ambulatory Visit (INDEPENDENT_AMBULATORY_CARE_PROVIDER_SITE_OTHER): Payer: Medicare Other | Admitting: *Deleted

## 2011-11-25 VITALS — BP 139/55 | HR 51 | Resp 16 | Ht 60.0 in | Wt 137.0 lb

## 2011-11-25 DIAGNOSIS — I6529 Occlusion and stenosis of unspecified carotid artery: Secondary | ICD-10-CM

## 2011-11-25 DIAGNOSIS — Z8673 Personal history of transient ischemic attack (TIA), and cerebral infarction without residual deficits: Secondary | ICD-10-CM

## 2011-11-25 NOTE — Addendum Note (Signed)
Addended by: Melodye Ped C on: 11/25/2011 02:42 PM   Modules accepted: Orders

## 2011-11-25 NOTE — Progress Notes (Signed)
[facility] [place]  Patient name: Tammy Wolf MRN: [id number] DOB: 12/17/1932 Sex: female  REASON FOR VISIT: follow up of carotid disease.  HPI: Tammy Wolf is a 78 y.o. female who I seen in the past with mild carotid disease. She had mild carotid plaque and was being seen at 18 month intervals. She comes in for 18 months scan. She states that she's had no recent weakness or paresthesias. In the past she states that she's had 2 "light strokes." She believes this was associated with her speech problems but no weakness or paresthesias. She does state that she's having problems with her memory and is noted some mental decline. She denies significant dizziness.  Past Medical History  Diagnosis Date  . Hypertension   . Carotid artery occlusion   . Atrial fibrillation     Family History  Problem Relation Age of Onset  . Cancer Mother   . Heart disease Father   . Heart attack Father   . Other Brother     MS    SOCIAL HISTORY: History  Substance Use Topics  . Smoking status: Never Smoker   . Smokeless tobacco: Never Used  . Alcohol Use: No    No Known Allergies  Current Outpatient Prescriptions  Medication Sig Dispense Refill  . CRESTOR 20 MG tablet Take 20 mg by mouth daily.      Marland Kitchen levothyroxine (SYNTHROID, LEVOTHROID) 100 MCG tablet Take 100 mcg by mouth daily.      . metoprolol succinate (TOPROL XL) 50 MG 24 hr tablet Take 50 mg by mouth daily. Take with or immediately following a meal.      . multivitamin-iron-minerals-folic acid (CENTRUM) chewable tablet Chew 1 tablet by mouth daily.      Marland Kitchen PRADAXA 150 MG CAPS Take 150 mg by mouth 2 (two) times daily.      . sertraline (ZOLOFT) 50 MG tablet Take 25 mg by mouth daily. 1/2 50 mg tablet daily      . traZODone (DESYREL) 50 MG tablet Take 50 mg by mouth daily.      . Triamterene-HCTZ (MAXZIDE-25 PO) Take 25 mg by mouth.      Marland Kitchen aspirin 81 MG tablet Take 81 mg by mouth daily.      . calcium  citrate-vitamin D 200-200 MG-UNIT TABS Take 1 tablet by mouth daily.      . cholecalciferol (VITAMIN D) 400 UNITS TABS Take 400 Units by mouth.        REVIEW OF SYSTEMS: Tammy Wolf ] denotes positive finding; [  ] denotes negative finding  CARDIOVASCULAR:  [ ]  chest pain   [ ]  chest pressure   [ ]  palpitations   [ ]  orthopnea   Tammy Wolf ] dyspnea on exertion   [ ]  claudication   [ ]  rest pain   [ ]  DVT   [ ]  phlebitis PULMONARY:   [ ]  productive cough   [ ]  asthma   [ ]  wheezing NEUROLOGIC:   [ ]  weakness  [ ]  paresthesias  [ ]  aphasia  [ ]  amaurosis  [ ]  dizziness HEMATOLOGIC:   [ ]  bleeding problems   [ ]  clotting disorders MUSCULOSKELETAL:  [ ]  joint pain   [ ]  joint swelling [ ]  leg swelling GASTROINTESTINAL: [ ]   blood in stool  [ ]   hematemesis GENITOURINARY:  [ ]   dysuria  [ ]   hematuria PSYCHIATRIC:  [ ]  history of major depression INTEGUMENTARY:  [ ]  rashes  [ ]   ulcers CONSTITUTIONAL:  [ ]  fever   [ ]  chills  PHYSICAL EXAM: Filed Vitals:   11/25/11 1012 11/25/11 1017  BP: 146/55 139/55  Pulse: 51   Resp: 16   Height: 5' (1.524 m)   Weight: 137 lb (62.143 kg)   SpO2: 100%    Body mass index is 26.76 kg/(m^2). GENERAL: The patient is a well-nourished female, in no acute distress. The vital signs are documented above. CARDIOVASCULAR: There is a regular rate and rhythm. I do not detect carotid bruits. She has palpable pedal pulses. She has no significant lower extremity swelling. PULMONARY: There is good air exchange bilaterally without wheezing or rales. ABDOMEN: Soft and non-tender with normal pitched bowel sounds.  MUSCULOSKELETAL: There are no major deformities or cyanosis. NEUROLOGIC: No focal weakness or paresthesias are detected. SKIN: There are no ulcers or rashes noted. PSYCHIATRIC: The patient has a normal affect.  DATA:  I have independently interpreted her carotid duplex scan which shows no significant carotid stenosis bilaterally. Vertebral arteries are patent with normally  directed flow. She does have some tortuosity of the distal internal carotid arteries but no stenosis is noted.  MEDICAL ISSUES: I reassured her that her carotid duplex scan shows no significant carotid plaque. Given her previous history of strokes in the past and we should continue with 18 month follow up and a carotid duplex at that time which I have ordered. She knows to call sooner if she has problems. In the meantime she knows to continue taking her aspirin.  Bernell Sigal S [facility] [place] Beeper: [phone]

## 2012-03-25 ENCOUNTER — Emergency Department (HOSPITAL_COMMUNITY): Payer: Medicare Other

## 2012-03-25 ENCOUNTER — Emergency Department (HOSPITAL_COMMUNITY)
Admission: EM | Admit: 2012-03-25 | Discharge: 2012-03-26 | Disposition: A | Discharge status: Home / Self Care | Payer: Medicare Other | Attending: Emergency Medicine | Admitting: Emergency Medicine

## 2012-03-25 ENCOUNTER — Encounter (HOSPITAL_COMMUNITY): Payer: Self-pay | Admitting: Emergency Medicine

## 2012-03-25 DIAGNOSIS — G459 Transient cerebral ischemic attack, unspecified: Secondary | ICD-10-CM

## 2012-03-25 DIAGNOSIS — R4701 Aphasia: Secondary | ICD-10-CM | POA: Insufficient documentation

## 2012-03-25 DIAGNOSIS — I10 Essential (primary) hypertension: Secondary | ICD-10-CM | POA: Insufficient documentation

## 2012-03-25 DIAGNOSIS — Z8679 Personal history of other diseases of the circulatory system: Secondary | ICD-10-CM | POA: Insufficient documentation

## 2012-03-25 DIAGNOSIS — Z79899 Other long term (current) drug therapy: Secondary | ICD-10-CM | POA: Insufficient documentation

## 2012-03-25 LAB — CBC WITH DIFFERENTIAL/PLATELET
Basophils Absolute: 0.1 K/uL (ref 0.0–0.1)
Basophils Relative: 1 % (ref 0–1)
Eosinophils Absolute: 0.3 K/uL (ref 0.0–0.7)
Eosinophils Relative: 3 % (ref 0–5)
HCT: 38.5 % (ref 36.0–46.0)
Hemoglobin: 13.2 g/dL (ref 12.0–15.0)
Lymphocytes Relative: 29 % (ref 12–46)
Lymphs Abs: 2.2 K/uL (ref 0.7–4.0)
MCH: 32.8 pg (ref 26.0–34.0)
MCHC: 34.3 g/dL (ref 30.0–36.0)
MCV: 95.8 fL (ref 78.0–100.0)
Monocytes Absolute: 0.7 10*3/uL (ref 0.1–1.0)
Monocytes Relative: 10 % (ref 3–12)
Neutro Abs: 4.3 10*3/uL (ref 1.7–7.7)
Neutrophils Relative %: 57 % (ref 43–77)
Platelets: 290 K/uL (ref 150–400)
RBC: 4.02 MIL/uL (ref 3.87–5.11)
RDW: 13.5 % (ref 11.5–15.5)
WBC: 7.6 K/uL (ref 4.0–10.5)

## 2012-03-25 LAB — BASIC METABOLIC PANEL WITH GFR
Calcium: 9.5 mg/dL (ref 8.4–10.5)
GFR calc Af Amer: 70 mL/min — ABNORMAL LOW (ref >90)
GFR calc non Af Amer: 60 mL/min — ABNORMAL LOW (ref >90)
Glucose, Bld: 91 mg/dL (ref 70–99)
Potassium: 3.2 meq/L — ABNORMAL LOW (ref 3.5–5.1)
Sodium: 138 meq/L (ref 135–145)

## 2012-03-25 LAB — BASIC METABOLIC PANEL
BUN: 19 mg/dL (ref 6–23)
CO2: 28 mEq/L (ref 19–32)
Chloride: 99 mEq/L (ref 96–112)
Creatinine, Ser: 0.9 mg/dL (ref 0.50–1.10)

## 2012-03-25 NOTE — ED Notes (Signed)
Onset today 1500 sudden onset right eye blurry and had trouble getting words out over the phone. Last approx 3 minutes. Took half of a metoprolol. Symptoms resolved. Spoke with Dr Donnie Aho who told patient to go to the ED. Currently ax4 states feels tired. Moves all extremities bilateral equal and strong.

## 2012-03-25 NOTE — ED Provider Notes (Signed)
History     CSN: [id number]  Arrival date & time 03/25/12  7829   First MD Initiated Contact with Patient 03/25/12 2011      Chief Complaint  Patient presents with  . Transient Ischemic Attack    (Consider location/radiation/quality/duration/timing/severity/associated sxs/prior treatment) HPI Comments: Tammy Wolf is a 79 y.o. female with a history of hypertension, atrial fibrillation and TIAs presents to the emergency department reporting that she had a TIA this morning and was advised by her cardiologist, Dr. Lurena Nida to come to the emergency department for further evaluation.  Patient describes a moment of expressive aphasia lasting approximately 3 minutes and associated with some right blurred vision.  Patient reports increased stress as her husband passed away last week.  Note that patient had a carotid Doppler performed by vascular surgeon Dr. Edilia Bo on November 19 2011 with no evidence of significant stenosis.  Patient's last brain imaging was performed a little under a year ago and found no acute abnormalities.  Patient is currently getting treated on Pradaxa.  Patient denies any ataxia, disequilibrium, continued slurred speech, unilateral weakness, syncope, chest pain or retained medical symptoms.  The history is provided by the patient.    Past Medical History  Diagnosis Date  . Hypertension   . Carotid artery occlusion   . Atrial fibrillation     Past Surgical History  Procedure Laterality Date  . Abdominal hysterectomy    . Cataract extraction, bilateral    . Appendectomy    . Carpal tunnel release      bilateral  . Tubal ligation      Family History  Problem Relation Age of Onset  . Cancer Mother   . Heart disease Father   . Heart attack Father   . Other Brother     MS    History  Substance Use Topics  . Smoking status: Never Smoker   . Smokeless tobacco: Never Used  . Alcohol Use: No    OB History   Grav Para Term Preterm Abortions TAB SAB Ect  Mult Living                  Review of Systems  All other systems reviewed and are negative.    Allergies  Review of patient's allergies indicates no known allergies.  Home Medications   Current Outpatient Rx  Name  Route  Sig  Dispense  Refill  . cholecalciferol (VITAMIN D) 400 UNITS TABS   Oral   Take 400 Units by mouth.         . levothyroxine (SYNTHROID, LEVOTHROID) 88 MCG tablet   Oral   Take 88 mcg by mouth daily.         . metoprolol succinate (TOPROL XL) 50 MG 24 hr tablet   Oral   Take 25-50 mg by mouth 2 (two) times daily. Take 1 tablet every morning and 1/2 tablet in the evening         . multivitamin-iron-minerals-folic acid (CENTRUM) chewable tablet   Oral   Chew 1 tablet by mouth daily.         Marland Kitchen PRADAXA 150 MG CAPS   Oral   Take 150 mg by mouth 2 (two) times daily.         . sertraline (ZOLOFT) 50 MG tablet   Oral   Take 25 mg by mouth daily.          . traZODone (DESYREL) 50 MG tablet   Oral   Take 25 mg  by mouth at bedtime.          . triamterene-hydrochlorothiazide (MAXZIDE-25) 37.5-25 MG per tablet   Oral   Take 1 tablet by mouth daily.           BP 121/49  Pulse 55  Temp(Src) 97.8 F (36.6 C) (Oral)  Resp 21  SpO2 98%  Physical Exam  Nursing note and vitals reviewed. Constitutional: She is oriented to person, place, and time. She appears well-developed and well-nourished. No distress.  HENT:  Head: Normocephalic and atraumatic.  Eyes: Conjunctivae and EOM are normal. Pupils are equal, round, and reactive to light. No scleral icterus.  Neck: Normal range of motion and full passive range of motion without pain. Neck supple. No JVD present. Carotid bruit is not present. No rigidity. No Brudzinski's sign noted.  Cardiovascular: Normal rate, regular rhythm, normal heart sounds and intact distal pulses.   Pulmonary/Chest: Effort normal and breath sounds normal. No respiratory distress. She has no wheezes. She has no  rales.  Musculoskeletal: Normal range of motion.  Lymphadenopathy:    She has no cervical adenopathy.  Neurological: She is alert and oriented to person, place, and time. She has normal strength. No cranial nerve deficit or sensory deficit. She displays a negative Romberg sign. Coordination and gait normal. GCS eye subscore is 4. GCS verbal subscore is 5. GCS motor subscore is 6.  A&O x3.  Able to follow commands. PERRL, EOMs, no vertical or bidirectional nystagmus. Shoulder shrug, facial muscles, tongue protrusion and swallow intact.  Motor strength 5/5 bilaterally including grip strength, triceps, hamstrings and ankle dorsiflexion.  Normal patellar DTRs.  Light touch intact in all 4 distal limbs.  Intact finger to nose, shin to heel and rapid alternating movements. No ataxia or dysequilibrium.   Skin: Skin is warm and dry. No rash noted. She is not diaphoretic.  Psychiatric: She has a normal mood and affect. Her behavior is normal.    ED Course  Procedures (including critical care time)  Labs Reviewed  BASIC METABOLIC PANEL - Abnormal; Notable for the following:    Potassium 3.2 (*)    GFR calc non Af Amer 60 (*)    GFR calc Af Amer 70 (*)    All other components within normal limits  CBC WITH DIFFERENTIAL  POCT I-STAT TROPONIN I   Ct Head Wo Contrast  03/25/2012  *RADIOLOGY REPORT*  Clinical Data: TIA.  Right-sided blurry vision. Expressive aphasia  CT HEAD WITHOUT CONTRAST  Technique:  Contiguous axial images were obtained from the base of the skull through the vertex without contrast.  Comparison: MRI 05/07/2011  Findings: Mild chronic small vessel ischemic changes throughout the deep white matter. No acute intracranial abnormality. Specifically, no hemorrhage, hydrocephalus, mass lesion, acute infarction, or significant intracranial injury.  No acute calvarial abnormality.  IMPRESSION: No acute intracranial abnormality.   Original Report Authenticated By: Charlett Nose, M.D.    MRI/MRI  results called in from Dr. Kearney Hard. No acute infarct seen   No diagnosis found.  Consult neurology: requests MR/MRA. If normal dc w PCP f-u. If abnormal re-page 6 MDM  TIA Patient is a 79 year old female with a history of recent TIA this year her that presents emergency department with expressive aphasia lasting approximately 3 minutes.  Imaging and labs performed and reviewed without acute abnormalities.  Neurology was consults as above.  Eyes no acute findings of ischemic event or noted on imaging patient will be discharged with primary care followup.  Discussed results with patient  and family members who are agreeable with disposition plan.  Patient is also been discussed with attending who also agrees. At this time there does not appear to be any evidence of an acute emergency medical condition and the patient appears stable for discharge with appropriate outpatient follow up.Diagnosis was discussed with patient who verbalizes understanding and is agreeable to discharge. Pt case discussed with Dr. Oletta Lamas who agrees with my plan.          Jaci Carrel, [place] 03/26/12 [phone]

## 2012-03-25 NOTE — ED Provider Notes (Signed)
Medical screening examination/treatment/procedure(s) were conducted as a shared visit with non-physician practitioner(s) and myself.  I personally evaluated the patient during the encounter  Pt with h/o TIA, had expressive aphasia this evening, now completely resolved.  Is on pradaxa already, has been seen a few times at [facility] neurology in the past.  Had carotid dopplers 4 months ago, brain MRI 11 months ago.  Will discuss with neurology here for any different recommendations other than close follow up with her neurologist.  Pt doesn't wish admission, is ok with this overall plan pending discussion with neurology.  Currently, no focal deficits CN2-12 intact No aphasia or dysphasia.   5/5 strength throughout, no arm drift.    RRR on monitor.  Breathing, even, no distress.  Impression: TIA  [name]. Shiah Berhow, MD 03/25/12 [phone]

## 2012-03-26 NOTE — ED Notes (Signed)
Paz, [place] in to see pt.

## 2012-06-01 ENCOUNTER — Ambulatory Visit: Payer: Medicare Other | Admitting: Neurosurgery

## 2012-06-01 ENCOUNTER — Other Ambulatory Visit (INDEPENDENT_AMBULATORY_CARE_PROVIDER_SITE_OTHER): Payer: Medicare Other

## 2012-06-01 DIAGNOSIS — I6529 Occlusion and stenosis of unspecified carotid artery: Secondary | ICD-10-CM

## 2012-06-08 ENCOUNTER — Other Ambulatory Visit: Payer: Self-pay | Admitting: *Deleted

## 2012-06-08 DIAGNOSIS — G459 Transient cerebral ischemic attack, unspecified: Secondary | ICD-10-CM

## 2012-06-10 ENCOUNTER — Encounter: Payer: Self-pay | Admitting: Vascular Surgery

## 2013-04-04 ENCOUNTER — Other Ambulatory Visit: Payer: Self-pay | Admitting: Vascular Surgery

## 2013-04-04 DIAGNOSIS — G459 Transient cerebral ischemic attack, unspecified: Secondary | ICD-10-CM

## 2013-04-04 DIAGNOSIS — I6529 Occlusion and stenosis of unspecified carotid artery: Secondary | ICD-10-CM

## 2013-08-01 ENCOUNTER — Telehealth: Payer: Self-pay | Admitting: Cardiology

## 2013-08-01 ENCOUNTER — Other Ambulatory Visit: Payer: Self-pay | Admitting: Cardiology

## 2013-08-01 ENCOUNTER — Encounter: Payer: Self-pay | Admitting: Cardiology

## 2013-08-01 DIAGNOSIS — R42 Dizziness and giddiness: Secondary | ICD-10-CM

## 2013-08-01 NOTE — Progress Notes (Signed)
Patient ID: Donovan KailJanice K Wesely, female   DOB: 05/31/1932, 80 y.o.   MRN: [id number]   [name], Meygan    Date of visit:  08/01/2013 DOB:  05/31/1932    Age:  80 yrs. Medical record number:  [id number]     Account number:  [id number] Primary Care Shalina Norfolk: Burnell BlanksHAMRICK, MAURA ____________________________ CURRENT DIAGNOSES  1. Dizziness  2. Hypertension-Essential (Benign)  3. Arrhythmia-Atrial Fibrillation  4. Carotid Artery Stenosis  5. Personal history of TIA or stroke without residua  6. Long Term Use Anticoagulant  7. Hypothyroidism ____________________________ ALLERGIES  No Known Allergies ____________________________ MEDICATIONS  1. Toprol XL 50 mg tablet extended release 24 hr, 1-1/2 p.o. daily  2. Crestor 20 mg Tablet, 1 p.o. daily  3. trazodone 50 mg Tablet, 1/2 qhs  4. Synthroid 88 mcg Tablet, 1 p.o. daily  5. Vitamin D 1,000 unit Tablet, 2 qd  6. multivitamin Tablet, 1 p.o. daily  7. Pradaxa 150 mg capsule, BID  8. triamterene 37.5 mg-hydrochlorothiazide 25 mg tablet, 1/2 tab daily  9. meclizine 25 mg tablet, q 8 h ____________________________ CHIEF COMPLAINTS  Dizziness ____________________________ HISTORY OF PRESENT ILLNESS  Patient seen back again for cardiac evaluation. She continues to complain of malaise, fatigue and dizziness. She complains that her head feels full and that she has episodes of dizziness when she is up on her feet. She describes it as an unsteadiness but not necessarily vertigo. She has been seen 3 times at the urgent care center and was seen there yesterday and told to followup with cardiology today because of an abnormal EKG. She has not had syncope. She denies recurrent numbness or tingling or difficulty with speech. She does not have shortness of breath and has no symptoms at all suggestive of angina or chest pain. She has no PND, orthopnea or edema. ____________________________ PAST HISTORY  Past Medical Illnesses:  hypothyroidism, borderline hypertension,  history of anxiety, hyperlipidemia, lumbar disc disease, TIA March 2011;  Cardiovascular Illnesses:  atrial fibrillation-paroxysmal;  Surgical Procedures:  appendectomy, tonsillectomy, tubal ligation, lining of uterus removed, carpal tunnel release, hysterectomy, bladder suspension;  Cardiology Procedures-Invasive:  no previous interventional or invasive cardiology procedures;  Cardiology Procedures-Noninvasive:  treadmill cardiolite July 2003, adenosine cardiolite May 2006, echocardiogram March 2011, event monitor March 2011, echocardiogram March 2013;  Peripheral Vascular Procedures:  carotid doppler October 2013;  LVEF of 65% documented via echocardiogram on 05/06/2011,  CHADS Score:  4,  CHA2DS2-VASC Score:  7 ____________________________ CARDIO-PULMONARY TEST DATES EKG Date:  08/01/2013;  Holter/Event Monitor Date: 03-28-202011;  Nuclear Study Date:  06/25/2004;  Echocardiography Date: 05/06/2011;  Chest Xray Date: 01/06/1999;   ____________________________ FAMILY HISTORY Father -- Father dead, Myocardial infarction Mother -- Mother dead, Leukemia ____________________________ SOCIAL HISTORY Alcohol Use:  no alcohol use;  Smoking:  never smoked;  Diet:  regular diet without modifications;  Lifestyle:  widowed;  Exercise:  exercise is limited due to physical disability;  Occupation:  retired Geologist, 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[employer];  Residence:  lives alone;   ____________________________ REVIEW OF SYSTEMS General:  malaise and fatigue Eyes: wears eye glasses/contact lenses, cataracts Ears, Nose, Throat, Mouth:  chronic sinusitis Respiratory: mild dyspnea with exertion Cardiovascular:  please review HPI Genitourinary-Female: no dysuria, urgency, frequency, UTIs, or stress incontinence Musculoskeletal:  chronic back pain, restless legs, sciatica Neurological:  dizziness Psychiatric:  anxiety, depression  ____________________________ PHYSICAL EXAMINATION VITAL SIGNS  Blood Pressure:  124/60  Sitting, Right arm, regular cuff  , 120/66 Standing, Right arm and regular cuff   Pulse:  54/min. Weight:  138.50 lbs. Height:  60"BMI: 27  Constitutional:  anxious, sad apperaring white female, in no acute distress Head:  normocephalic, normal hair pattern, no masses or tenderness ENT:  ears, nose and throat reveal no gross abnormalities.  Dentition good. Neck:  right carotid bruit present, no palpable masses or adenopathy Chest:  normal symmetry, clear to auscultation. Cardiac:  regular rhythm, normal S1 and S2, no S3 or S4, no murmurs,clicks, or rub heard Abdomen:  abdomen soft,non-tender, no masses, no hepatospenomegaly, or aneurysm noted Peripheral Pulses:  the femoral,dorsalis pedis, and posterior tibial pulses are full and equal bilaterally with no bruits auscultated. Extremities & Back:  no deformities, clubbing, cyanosis, erythema or edema observed. Normal muscle strength and tone. Neurological:  no gross motor or sensory deficits noted, affect appropriate, oriented x3. ____________________________ MOST RECENT LIPID PANEL 11/17/12  CHOL TOTL 170 mg/dl, LDL 68 calc, HDL 67 mg/dl and TRIGLYCER 161176 mg/dl ____________________________ IMPRESSIONS/PLAN  1. Recurrent dizziness in a patient with prior TIA 2. Abnormal EKG with some worsening of inferior T wave inversions but no ischemic symptoms 3. History of TIA 4. History of paroxysmal atrial fibrillation 5. Long-term anticoagulation with Pradaxa  Recommendations:  She is anxious and quite difficult to evaluate. Absolutely no cardiac symptoms. I would like for her to wear an event monitor because of the dizziness and the because of the worsening of the T-wave changes that she have a Lexiscan Myoview. Obtain MRI of the brain and have neurology referral because of the recurrent neurologic symptoms. ____________________________ TODAYS ORDERS  1. 12 Lead EKG: Today  2. [place]: Today  3. Return Visit: 1 month  4. Lexiscan 1  day: First Available  5. MRI Head w/o Contrast: First Available  6. Neurology Consult: Schedule ASAP                       ____________________________ Cardiology Physician:  Darden PalmerW. Spencer Tilley, Jr. MD [facility]

## 2013-08-01 NOTE — Telephone Encounter (Signed)
Thank you :)

## 2013-08-02 ENCOUNTER — Encounter: Payer: Self-pay | Admitting: *Deleted

## 2013-08-03 ENCOUNTER — Encounter (INDEPENDENT_AMBULATORY_CARE_PROVIDER_SITE_OTHER): Payer: Self-pay

## 2013-08-03 ENCOUNTER — Encounter: Payer: Self-pay | Admitting: Adult Health

## 2013-08-03 ENCOUNTER — Other Ambulatory Visit: Payer: Medicare Other

## 2013-08-03 ENCOUNTER — Ambulatory Visit (INDEPENDENT_AMBULATORY_CARE_PROVIDER_SITE_OTHER): Payer: Medicare Other | Admitting: Adult Health

## 2013-08-03 VITALS — BP 125/58 | HR 58 | Ht 60.0 in | Wt 137.0 lb

## 2013-08-03 DIAGNOSIS — R42 Dizziness and giddiness: Secondary | ICD-10-CM

## 2013-08-03 NOTE — Patient Instructions (Signed)
Vertigo  Vertigo means you feel like you or your surroundings are moving when they are not. Vertigo can be dangerous if it occurs when you are at work, driving, or performing difficult activities.   CAUSES   Vertigo occurs when there is a conflict of signals sent to your brain from the visual and sensory systems in your body. There are many different causes of vertigo, including:   Infections, especially in the inner ear.   A bad reaction to a drug or misuse of alcohol and medicines.   Withdrawal from drugs or alcohol.   Rapidly changing positions, such as lying down or rolling over in bed.   A migraine headache.   Decreased blood flow to the brain.   Increased pressure in the brain from a head injury, infection, tumor, or bleeding.  SYMPTOMS   You may feel as though the world is spinning around or you are falling to the ground. Because your balance is upset, vertigo can cause nausea and vomiting. You may have involuntary eye movements (nystagmus).  DIAGNOSIS   Vertigo is usually diagnosed by physical exam. If the cause of your vertigo is unknown, your caregiver may perform imaging tests, such as an MRI scan (magnetic resonance imaging).  TREATMENT   Most cases of vertigo resolve on their own, without treatment. Depending on the cause, your caregiver may prescribe certain medicines. If your vertigo is related to body position issues, your caregiver may recommend movements or procedures to correct the problem. In rare cases, if your vertigo is caused by certain inner ear problems, you may need surgery.  HOME CARE INSTRUCTIONS    Follow your caregiver's instructions.   Avoid driving.   Avoid operating heavy machinery.   Avoid performing any tasks that would be dangerous to you or others during a vertigo episode.   Tell your caregiver if you notice that certain medicines seem to be causing your vertigo. Some of the medicines used to treat vertigo episodes can actually make them worse in some people.  SEEK  IMMEDIATE MEDICAL CARE IF:    Your medicines do not relieve your vertigo or are making it worse.   You develop problems with talking, walking, weakness, or using your arms, hands, or legs.   You develop severe headaches.   Your nausea or vomiting continues or gets worse.   You develop visual changes.   A family member notices behavioral changes.   Your condition gets worse.  MAKE SURE YOU:   Understand these instructions.   Will watch your condition.   Will get help right away if you are not doing well or get worse.  Document Released: 11/05/2004 Document Revised: 04/20/2011 Document Reviewed: 08/14/2010  ExitCare Patient Information 2015 [employer]. This information is not intended to replace advice given to you by your health care Deaun Rocha. Make sure you discuss any questions you have with your health care Erykah Lippert.

## 2013-08-03 NOTE — Progress Notes (Signed)
PATIENT: Tammy KailJanice K Wolf DOB: 06-Jun-1932  REASON FOR VISIT: follow up HISTORY FROM: patient  HISTORY OF PRESENT ILLNESS: Tammy Wolf is a 80 year old female with a history of TIA. She was seen at the office in March of 2013 by Dr. Vickey Wolf. She returns today for a complaint of dizziness that started 1 month ago. Patient states that the dizziness does not occur all the time. She describes it as the room is spinning states that it usually happens when she changes positions with her head. Her daughter states the other day she looked up to get a pill bottle and that triggered the dizziness. She reports that it does not last very long. She has been on the meclizine in the past for vertigo. Denies trouble with her balance unless she has dizziness. Denies issues with bowels and bladder. Denies headache with dizziness.  Denies nausea and vomiting. She has had workup by her cardiologist, and he feels that the dizziness is not cardiac related.   REVIEW OF SYSTEMS: Full 14 system review of systems performed and notable only for:  Constitutional: chills  Eyes: N/A Ear/Nose/Throat: N/A  Skin: N/A  Cardiovascular: N/A  Respiratory: N/A  Gastrointestinal: N/A  Genitourinary: N/A Hematology/Lymphatic: N/A  Endocrine: cold intolerance Musculoskeletal:N/A  Allergy/Immunology: N/A  Neurological: memory issues, dizziness, weakness Psychiatric: confusion  Sleep: N/a  ALLERGIES: No Known Allergies  HOME MEDICATIONS: Outpatient Prescriptions Prior to Visit  Medication Sig Dispense Refill  . cholecalciferol (VITAMIN D) 400 UNITS TABS Take 400 Units by mouth.      . levothyroxine (SYNTHROID, LEVOTHROID) 88 MCG tablet Take 88 mcg by mouth daily.      . metoprolol succinate (TOPROL XL) 50 MG 24 hr tablet Take 25-50 mg by mouth 2 (two) times daily. Take 1 tablet every morning and 1/2 tablet in the evening      . multivitamin-iron-minerals-folic acid (CENTRUM) chewable tablet Chew 1 tablet by mouth  daily.      Marland Kitchen. PRADAXA 150 MG CAPS Take 150 mg by mouth 2 (two) times daily.      Marland Kitchen. triamterene-hydrochlorothiazide (MAXZIDE-25) 37.5-25 MG per tablet Take 1 tablet by mouth daily.      . sertraline (ZOLOFT) 50 MG tablet Take 25 mg by mouth daily.       . traZODone (DESYREL) 50 MG tablet Take 25 mg by mouth at bedtime.        No facility-administered medications prior to visit.    PAST MEDICAL HISTORY: Past Medical History  Diagnosis Date  . Hypertension   . Carotid artery occlusion   . Atrial fibrillation   . Stroke 04/20/11  . Hyperlipidemia   . Thyroid disease     PAST SURGICAL HISTORY: Past Surgical History  Procedure Laterality Date  . Abdominal hysterectomy    . Cataract extraction, bilateral    . Appendectomy    . Carpal tunnel release      bilateral  . Tubal ligation    . Bladder tuck      FAMILY HISTORY: Family History  Problem Relation Age of Onset  . Cancer Mother   . Heart disease Father   . Heart attack Father   . Other Brother     MS  . Multiple sclerosis Daughter     SOCIAL HISTORY: History   Social History  . Marital Status: Married    Spouse Name: N/A    Number of Children: 4  . Years of Education: HS   Occupational History  . Retired  Social History Main Topics  . Smoking status: Never Smoker   . Smokeless tobacco: Never Used  . Alcohol Use: No  . Drug Use: No  . Sexual Activity: Not on file   Other Topics Concern  . Not on file   Social History Narrative   Patient is married    Patient lives at home with spouse.   Caffeine use:      PHYSICAL EXAM  Filed Vitals:   08/03/13 1450  BP: 125/58  Pulse: 58  Height: 5' (1.524 m)  Weight: 137 lb (62.143 kg)   Body mass index is 26.76 kg/(m^2).  Generalized: Well developed, in no acute distress   Neurological examination  Mentation: Alert oriented to time, place, history taking. Follows all commands speech and language fluent. MMSE 29/30 Cranial nerve II-XII:   Extraocular movements were full, visual field were full on confrontational test. Several beats of Horizontal nystagmus to right. Dix-Hallpike maneuver positive.  Motor: The motor testing reveals 5 over 5 strength of all 4 extremities. Good symmetric motor tone is noted throughout.  Sensory: Sensory testing is intact to soft touch on all 4 extremities. No evidence of extinction is noted.  Coordination: Cerebellar testing reveals good finger-nose-finger and heel-to-shin bilaterally.  Gait and station: Gait is normal.  Reflexes: Deep tendon reflexes are symmetric and normal bilaterally.   DIAGNOSTIC DATA (LABS, IMAGING, TESTING) - I reviewed patient records, labs, notes, testing and imaging myself where available.  Lab Results  Component Value Date   WBC 7.6 03/25/2012   HGB 13.2 03/25/2012   HCT 38.5 03/25/2012   MCV 95.8 03/25/2012   PLT 290 03/25/2012      Component Value Date/Time   NA 138 03/25/2012 1842   K 3.2* 03/25/2012 1842   CL 99 03/25/2012 1842   CO2 28 03/25/2012 1842   GLUCOSE 91 03/25/2012 1842   BUN 19 03/25/2012 1842   CREATININE 0.90 03/25/2012 1842   CALCIUM 9.5 03/25/2012 1842   PROT 6.9 09/23/2010 2127   ALBUMIN 4.0 09/23/2010 2127   AST 43* 09/23/2010 2127   ALT 57* 09/23/2010 2127   ALKPHOS 47 09/23/2010 2127   BILITOT 0.2* 09/23/2010 2127   GFRNONAA 60* 03/25/2012 1842   GFRAA 70* 03/25/2012 1842       ASSESSMENT AND PLAN 80 y.o. year old female  has a past medical history of Hypertension; Carotid artery occlusion; Atrial fibrillation; Stroke (04/20/11); Hyperlipidemia; and Thyroid disease. here with:  1. Dizziness  The patient has had vertigo in the past. Has tried meclizine in the past but reports it was not helpful. The patient already has an MRI of the brain ordered. I will refer the patient for vestibular rehab. She will follow-up in October with Tammy Wolf and at that time they will recheck carotid dopplers. If vestibular rehab is not successful we will check  carotid dopplers sooner. Patient was also concerned about her memory because she feels that she can be forgetful.  She scored a 29/30 on the MMSE. We will continue to monitor her memory for now. Patient should follow up in 3 months or sooner if needed.    Butch PennyMegan Millikan, MSN, NP-C 08/03/2013, 2:59 PM [facility] [address], [address] [place], [place] [address] [phone]  Note: This document was prepared with digital dictation and possible smart phrase technology. Any transcriptional errors that result from this process are unintentional.

## 2013-08-04 NOTE — Progress Notes (Signed)
I agree with the assessment and plan as directed by NP .The patient is known to me .   DOHMEIER,CARMEN, MD  

## 2013-08-06 ENCOUNTER — Ambulatory Visit
Admission: RE | Admit: 2013-08-06 | Discharge: 2013-08-06 | Disposition: A | Discharge status: Home / Self Care | Payer: Medicare Other | Source: Ambulatory Visit | Attending: Cardiology | Admitting: Cardiology

## 2013-08-06 DIAGNOSIS — R42 Dizziness and giddiness: Secondary | ICD-10-CM

## 2013-08-07 ENCOUNTER — Other Ambulatory Visit: Payer: Medicare Other

## 2013-08-09 ENCOUNTER — Telehealth: Payer: Self-pay | Admitting: Adult Health

## 2013-08-09 MED ORDER — MECLIZINE HCL 25 MG PO TABS
25.0000 mg | ORAL_TABLET | Freq: Three times a day (TID) | ORAL | Status: DC | PRN
Start: 2013-08-09 — End: 2013-08-16

## 2013-08-09 NOTE — Telephone Encounter (Signed)
I spoke with the patient's daughter regarding the MRI results. Explained that there was no acute finding that be the cause of the dizziness. Patient did have some nonspecific white matter changes that were stable compared to previous scan in 2013. Patient would like another prescription of the meclizine to have when she starts vestibular rehab. I will order that today.

## 2013-08-09 NOTE — Telephone Encounter (Signed)
Called pt and left message stating that her referral has already been sent and someone will be contacting her to set up an appt. Pt is also requesting MRI results. Please advise

## 2013-08-09 NOTE — Telephone Encounter (Signed)
I called the patient and left a message regarding the MRI results. I did not order this scan, that is why she has not heard from us. However I will be happy to discuss the results with her. I advised her in the message to give our office a call and I will go over the results.

## 2013-08-09 NOTE — Telephone Encounter (Signed)
Patient calling for MRI results and questioning the status of Rehab referral.  Please call and advise

## 2013-08-10 ENCOUNTER — Encounter: Payer: Self-pay | Admitting: Cardiology

## 2013-08-10 NOTE — Progress Notes (Signed)
Patient ID: Tammy Wolf, female   DOB: 12-31-1932, 80 y.o.   MRN: [id number]   Tammy Wolf, Zakiah    Date of visit:  08/10/2013 DOB:  12-31-1932    Age:  [age] yrs. Medical record number:  [id number]     Account number:  [id number] Primary Care Provider: Burnell BlanksHAMRICK, MAURA ____________________________ CURRENT DIAGNOSES  1. Dizziness  2. Hypertension-Essential (Benign)  3. Arrhythmia-Atrial Fibrillation  4. Carotid Artery Stenosis  5. Personal history of TIA or stroke without residua  6. Long Term Use Anticoagulant  7. Hypothyroidism ____________________________ ALLERGIES  No Known Allergies ____________________________ MEDICATIONS  1. Toprol XL 50 mg tablet extended release 24 hr, 1-1/2 p.o. daily  2. Crestor 20 mg Tablet, 1 p.o. daily  3. trazodone 50 mg Tablet, 1/2 qhs  4. Synthroid 88 mcg Tablet, 1 p.o. daily  5. Vitamin D 1,000 unit Tablet, 2 qd  6. multivitamin Tablet, 1 p.o. daily  7. Pradaxa 150 mg capsule, BID  8. triamterene 37.5 mg-hydrochlorothiazide 25 mg tablet, 1/2 tab daily  9. meclizine 25 mg tablet, q 8 h ____________________________ CHIEF COMPLAINTS  Followup of Arrhythmia-Atrial Fibrillation  Followup of Dizziness ____________________________ HISTORY OF PRESENT ILLNESS  Patient seen early for cardiac followup. She has been during an event monitor and has been having some atrial flutter and fibrillation. On June 25 she had a 6.5 seconds pulse post termination and had a another 3.2 second post termination pause. It has been hard to equate whether that has been associated with symptoms or not. She is on Pradaxa for a prior history of paroxysmal atrial fibrillation. She requires a beta blocker to keep her from going fast when she is in atrial fibrillation. At this point it is unclear whether she has both vertigo and sick sinus syndrome and atrial fibrillation or whether her dizziness is due to pauses that are occurring. ____________________________ PAST HISTORY  Past Medical  Illnesses:  hypothyroidism, borderline hypertension, history of anxiety, hyperlipidemia, lumbar disc disease, TIA March 2011;  Cardiovascular Illnesses:  atrial fibrillation-paroxysmal;  Surgical Procedures:  appendectomy, tonsillectomy, tubal ligation, lining of uterus removed, carpal tunnel release, hysterectomy, bladder suspension;  Cardiology Procedures-Invasive:  no previous interventional or invasive cardiology procedures;  Cardiology Procedures-Noninvasive:  treadmill cardiolite July 2003, adenosine cardiolite May 2006, echocardiogram March 2011, event monitor March 2011, echocardiogram March 2013, event monitor June 2015, lexiscan cardiolite June 2015;  Peripheral Vascular Procedures:  carotid doppler October 2013;  LVEF of 66% documented via nuclear study on 08/03/2013,  CHADS Score:  4,  CHA2DS2-VASC Score:  7 ____________________________ CARDIO-PULMONARY TEST DATES EKG Date:  08/01/2013;  Holter/Event Monitor Date: 08/02/2013;  Nuclear Study Date:  08/03/2013;  Echocardiography Date: 05/06/2011;  Chest Xray Date: 01/06/1999;   ____________________________ FAMILY HISTORY Father -- Father dead, Myocardial infarction Mother -- Mother dead, Leukemia ____________________________ SOCIAL HISTORY Alcohol Use:  no alcohol use;  Smoking:  never smoked;  Diet:  regular diet without modifications;  Lifestyle:  widowed;  Exercise:  exercise is limited due to physical disability;  Occupation:  retired Geologist, 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[employer];  Residence:  lives alone;   ____________________________ REVIEW OF SYSTEMS General:  malaise and fatigue Eyes: wears eye glasses/contact lenses, cataracts Ears, Nose, Throat, Mouth:  chronic sinusitis Respiratory: mild dyspnea with exertion Cardiovascular:  please review HPI Genitourinary-Female: no dysuria, urgency, frequency, UTIs, or stress incontinence Musculoskeletal:  chronic back pain, restless legs, sciatica Neurological:  dizziness Psychiatric:  anxiety,  depression  ____________________________ PHYSICAL EXAMINATION VITAL SIGNS  Blood Pressure:  126/72 Sitting, Right  arm, regular cuff  , 128/70 Standing, Right arm and regular cuff   Pulse:  76/min. Weight:  139.00 lbs. Height:  60"BMI: 27  Constitutional:  anxious, sad apperaring white female, in no acute distress Head:  normocephalic, normal hair pattern, no masses or tenderness ENT:  ears, nose and throat reveal no gross abnormalities.  Dentition good. Neck:  right carotid bruit present, no palpable masses or adenopathy Chest:  normal symmetry, clear to auscultation. Cardiac:  regular rhythm, normal S1 and S2, no S3 or S4, no murmurs,clicks, or rub heard Peripheral Pulses:  the femoral,dorsalis pedis, and posterior tibial pulses are full and equal bilaterally with no bruits auscultated. Extremities & Back:  no deformities, clubbing, cyanosis, erythema or edema observed. Normal muscle strength and tone. Neurological:  no gross motor or sensory deficits noted, affect appropriate, oriented x3. ____________________________ MOST RECENT LIPID PANEL 11/17/12  CHOL TOTL 170 mg/dl, LDL 68 calc, HDL 67 mg/dl and TRIGLYCER 130176 mg/dl ____________________________ IMPRESSIONS/PLAN  1. Paroxysmal atrial flutter and fibrillation with post termination pulses as long as 6 seconds 2. Dizziness possibly vertigo or due to pauses 3. History of stroke and TIA 4. Malaise and fatigue that may be due to atrial fibrillation and flutter which is paroxysmal  Recommendations:  Referral to EP doctor and followup after he has seen her.  ____________________________ Cardiology Physician:  Darden PalmerW. Spencer Taejah Ohalloran, Jr. MD [facility]

## 2013-08-14 ENCOUNTER — Encounter: Payer: Self-pay | Admitting: Cardiology

## 2013-08-14 DIAGNOSIS — Z7901 Long term (current) use of anticoagulants: Secondary | ICD-10-CM | POA: Insufficient documentation

## 2013-08-14 DIAGNOSIS — I4891 Unspecified atrial fibrillation: Secondary | ICD-10-CM | POA: Insufficient documentation

## 2013-08-14 DIAGNOSIS — I495 Sick sinus syndrome: Secondary | ICD-10-CM | POA: Insufficient documentation

## 2013-08-16 ENCOUNTER — Encounter: Payer: Self-pay | Admitting: *Deleted

## 2013-08-16 ENCOUNTER — Ambulatory Visit (INDEPENDENT_AMBULATORY_CARE_PROVIDER_SITE_OTHER): Payer: Medicare Other | Admitting: Internal Medicine

## 2013-08-16 ENCOUNTER — Encounter: Payer: Self-pay | Admitting: Internal Medicine

## 2013-08-16 VITALS — BP 160/74 | HR 66 | Ht 60.0 in | Wt 139.0 lb

## 2013-08-16 DIAGNOSIS — R55 Syncope and collapse: Secondary | ICD-10-CM | POA: Insufficient documentation

## 2013-08-16 DIAGNOSIS — I480 Paroxysmal atrial fibrillation: Secondary | ICD-10-CM

## 2013-08-16 DIAGNOSIS — I4891 Unspecified atrial fibrillation: Secondary | ICD-10-CM

## 2013-08-16 DIAGNOSIS — Z01812 Encounter for preprocedural laboratory examination: Secondary | ICD-10-CM

## 2013-08-16 DIAGNOSIS — Z01810 Encounter for preprocedural cardiovascular examination: Secondary | ICD-10-CM

## 2013-08-16 LAB — CBC WITH DIFFERENTIAL/PLATELET
BASOS PCT: 0.5 % (ref 0.0–3.0)
Basophils Absolute: 0 10*3/uL (ref 0.0–0.1)
EOS PCT: 3.5 % (ref 0.0–5.0)
Eosinophils Absolute: 0.3 10*3/uL (ref 0.0–0.7)
HCT: 40.5 % (ref 36.0–46.0)
Hemoglobin: 13.5 g/dL (ref 12.0–15.0)
LYMPHS PCT: 27.7 % (ref 12.0–46.0)
Lymphs Abs: 2.2 10*3/uL (ref 0.7–4.0)
MCHC: 33.4 g/dL (ref 30.0–36.0)
MCV: 98.3 fl (ref 78.0–100.0)
MONO ABS: 0.8 10*3/uL (ref 0.1–1.0)
MONOS PCT: 10.1 % (ref 3.0–12.0)
NEUTROS PCT: 58.2 % (ref 43.0–77.0)
Neutro Abs: 4.6 10*3/uL (ref 1.4–7.7)
PLATELETS: 277 10*3/uL (ref 150.0–400.0)
RBC: 4.12 Mil/uL (ref 3.87–5.11)
RDW: 13.6 % (ref 11.5–15.5)
WBC: 7.9 10*3/uL (ref 4.0–10.5)

## 2013-08-16 LAB — BASIC METABOLIC PANEL
BUN: 13 mg/dL (ref 6–23)
CHLORIDE: 104 meq/L (ref 96–112)
CO2: 31 mEq/L (ref 19–32)
CREATININE: 0.9 mg/dL (ref 0.4–1.2)
Calcium: 9.6 mg/dL (ref 8.4–10.5)
GFR: 61.86 mL/min (ref >60.00)
Glucose, Bld: 96 mg/dL (ref 70–99)
Potassium: 3.7 mEq/L (ref 3.5–5.1)
Sodium: 139 mEq/L (ref 135–145)

## 2013-08-16 MED ORDER — METOPROLOL SUCCINATE ER 25 MG PO TB24: 50.0000 mg | ORAL_TABLET | Freq: Every day | ORAL | Status: DC

## 2013-08-16 MED ORDER — FLECAINIDE ACETATE 50 MG PO TABS: 50.0000 mg | ORAL_TABLET | Freq: Two times a day (BID) | ORAL | Status: DC

## 2013-08-16 NOTE — Patient Instructions (Addendum)
Your physician has recommended you make the following change in your medication:  1) START Flecainide 50 mg twice daily 2) DECREASE Toprol to 25 mg daily 3) STOP Meclizine  Pre-procedure labs today: BMET, CBCD  Your physician has recommended that you have a pacemaker inserted. A pacemaker is a small device that is placed under the skin of your chest or abdomen to help control abnormal heart rhythms. This device uses electrical pulses to prompt the heart to beat at a normal rate. Pacemakers are used to treat heart rhythms that are too slow. Wire (leads) are attached to the pacemaker that goes into the chambers of you heart. This is done in the hospital and usually requires and overnight stay. Please see the instruction sheet given to you today for more information.  You are scheduled for 08/23/13  Your wound check is scheduled for 09/04/13 at 9:00 a.m. at [address]

## 2013-08-16 NOTE — Progress Notes (Signed)
HPI Tammy Wolf is referred today by Dr. Donnie Ahoilley for evaluation of post-termination pauses associated with atrial fibrillation. She is a very pleasant 80 yo woman with a h/o recurrent "inner ear problems" over the past 3 months. She wore a cardiac monitor and was found to have atrial fib with postermination pauses of 3-6 seconds. She fell out in the store 3 weeks ago but was caught and did not hit the ground. She has minimal palpitations. She has been previously diagnosed with atrial fib. And is on pradaxa. No Known Allergies   Current Outpatient Prescriptions  Medication Sig Dispense Refill  . cholecalciferol (VITAMIN D) 400 UNITS TABS Take 400 Units by mouth.      . levothyroxine (SYNTHROID, LEVOTHROID) 88 MCG tablet Take 88 mcg by mouth daily.      . meclizine (ANTIVERT) 25 MG tablet Take 1 tablet (25 mg total) by mouth 3 (three) times daily as needed for dizziness.  60 tablet  3  . metoprolol succinate (TOPROL XL) 50 MG 24 hr tablet Take 25-50 mg by mouth 2 (two) times daily. Take 1 tablet every morning and 1/2 tablet in the evening      . multivitamin-iron-minerals-folic acid (CENTRUM) chewable tablet Chew 1 tablet by mouth daily.      Marland Kitchen. PRADAXA 150 MG CAPS Take 150 mg by mouth 2 (two) times daily.      Marland Kitchen. triamterene-hydrochlorothiazide (MAXZIDE-25) 37.5-25 MG per tablet Take 1 tablet by mouth daily.       No current facility-administered medications for this visit.     Past Medical History  Diagnosis Date  . Hypertension   . Carotid artery occlusion   . Atrial fibrillation   . Stroke 04/20/11  . Hyperlipidemia   . Thyroid disease     ROS:   All systems reviewed and negative except as noted in the HPI.   Past Surgical History  Procedure Laterality Date  . Abdominal hysterectomy    . Cataract extraction, bilateral    . Appendectomy    . Carpal tunnel release      bilateral  . Tubal ligation    . Bladder tuck       Family History  Problem Relation Age of  Onset  . Cancer Mother   . Heart disease Father   . Heart attack Father   . Other Brother     MS  . Multiple sclerosis Daughter      History   Social History  . Marital Status: Married    Spouse Name: N/A    Number of Children: 4  . Years of Education: HS   Occupational History  . Retired    Social History Main Topics  . Smoking status: Never Smoker   . Smokeless tobacco: Never Used  . Alcohol Use: No  . Drug Use: No  . Sexual Activity: Not on file   Other Topics Concern  . Not on file   Social History Narrative   Patient is married    Patient lives at home with spouse.   Caffeine use:     BP 160/74  Pulse 66  Ht 5' (1.524 m)  Wt 139 lb (63.05 kg)  BMI 27.15 kg/m2  Physical Exam:  Well appearing 80 yo woman, NAD HEENT: Unremarkable Neck:  No JVD, no thyromegally Back:  No CVA tenderness Lungs:  Clear with no wheezes HEART:  Regular rate rhythm, no murmurs, no rubs, no clicks Abd:  soft, positive bowel sounds, no organomegally, no  rebound, no guarding Ext:  2 plus pulses, no edema, no cyanosis, no clubbing Skin:  No rashes no nodules Neuro:  CN II through XII intact, motor grossly intact  Cardiac Monitor - atrial fib with post termination pauses   Assess/Plan:

## 2013-08-16 NOTE — Assessment & Plan Note (Addendum)
She is having post -termination pauses and symptoms. She has rapid atrial fib. I have recommended she undergo insertion of a PPM. Because of her rapid atrial fib, I have recommended PPM as there is nothing we can reverse to prevent the post termination pauses and control her ventricular rate. The risks/benefits/goals/expectations of her symptoms have been discussed with the patient and she wishes to proceed. In the interim, I have asked her to stop her meclizine, reduce metoprolol to 25 mg daily, start flecainide 50 mg twice daily and not to drive and not to operate any equipment or do any strenuous activity.

## 2013-08-17 ENCOUNTER — Encounter (HOSPITAL_COMMUNITY): Payer: Self-pay | Admitting: Pharmacy Technician

## 2013-08-18 ENCOUNTER — Telehealth: Payer: Self-pay | Admitting: Internal Medicine

## 2013-08-18 NOTE — Telephone Encounter (Signed)
Spoke with patient and pts caregiver Eden to go over Dr. Lubertha Basqueaylor's last OV instructions. They were doing everything correctly, but just wanted to call and double check.

## 2013-08-18 NOTE — Telephone Encounter (Signed)
New message    Caregiver has questions regarding medication - stop meclizine 25 mg as request.   Daughter wanted to double check should continue to take  maxzide

## 2013-08-21 ENCOUNTER — Emergency Department (HOSPITAL_COMMUNITY): Payer: Medicare Other

## 2013-08-21 ENCOUNTER — Encounter (HOSPITAL_COMMUNITY): Payer: Self-pay | Admitting: Emergency Medicine

## 2013-08-21 ENCOUNTER — Inpatient Hospital Stay (HOSPITAL_COMMUNITY)
Admission: EM | Admit: 2013-08-21 | Discharge: 2013-08-23 | DRG: 244 | Disposition: A | Discharge status: Home / Self Care | Payer: Medicare Other | Attending: Cardiology | Admitting: Cardiology

## 2013-08-21 ENCOUNTER — Telehealth: Payer: Self-pay | Admitting: Physician Assistant

## 2013-08-21 DIAGNOSIS — E785 Hyperlipidemia, unspecified: Secondary | ICD-10-CM | POA: Diagnosis present

## 2013-08-21 DIAGNOSIS — Z8673 Personal history of transient ischemic attack (TIA), and cerebral infarction without residual deficits: Secondary | ICD-10-CM

## 2013-08-21 DIAGNOSIS — I10 Essential (primary) hypertension: Secondary | ICD-10-CM | POA: Diagnosis present

## 2013-08-21 DIAGNOSIS — I4891 Unspecified atrial fibrillation: Principal | ICD-10-CM | POA: Diagnosis present

## 2013-08-21 DIAGNOSIS — R000 Tachycardia, unspecified: Secondary | ICD-10-CM | POA: Diagnosis present

## 2013-08-21 DIAGNOSIS — Z8249 Family history of ischemic heart disease and other diseases of the circulatory system: Secondary | ICD-10-CM

## 2013-08-21 DIAGNOSIS — Z7901 Long term (current) use of anticoagulants: Secondary | ICD-10-CM

## 2013-08-21 DIAGNOSIS — I6529 Occlusion and stenosis of unspecified carotid artery: Secondary | ICD-10-CM | POA: Diagnosis present

## 2013-08-21 DIAGNOSIS — I495 Sick sinus syndrome: Secondary | ICD-10-CM | POA: Diagnosis present

## 2013-08-21 DIAGNOSIS — I455 Other specified heart block: Secondary | ICD-10-CM | POA: Diagnosis present

## 2013-08-21 DIAGNOSIS — E039 Hypothyroidism, unspecified: Secondary | ICD-10-CM | POA: Diagnosis present

## 2013-08-21 DIAGNOSIS — I4892 Unspecified atrial flutter: Secondary | ICD-10-CM | POA: Diagnosis present

## 2013-08-21 DIAGNOSIS — E876 Hypokalemia: Secondary | ICD-10-CM | POA: Diagnosis present

## 2013-08-21 DIAGNOSIS — R42 Dizziness and giddiness: Secondary | ICD-10-CM

## 2013-08-21 DIAGNOSIS — I480 Paroxysmal atrial fibrillation: Secondary | ICD-10-CM

## 2013-08-21 LAB — CBC
HCT: 45 % (ref 36.0–46.0)
Hemoglobin: 15.2 g/dL — ABNORMAL HIGH (ref 12.0–15.0)
MCH: 32.9 pg (ref 26.0–34.0)
MCHC: 33.8 g/dL (ref 30.0–36.0)
MCV: 97.4 fL (ref 78.0–100.0)
PLATELETS: 292 10*3/uL (ref 150–400)
RBC: 4.62 MIL/uL (ref 3.87–5.11)
RDW: 13.3 % (ref 11.5–15.5)
WBC: 7.9 10*3/uL (ref 4.0–10.5)

## 2013-08-21 LAB — I-STAT TROPONIN, ED: Troponin i, poc: 0.01 ng/mL (ref 0.00–0.08)

## 2013-08-21 LAB — BASIC METABOLIC PANEL
ANION GAP: 16 — AB (ref 5–15)
BUN: 13 mg/dL (ref 6–23)
CALCIUM: 9.7 mg/dL (ref 8.4–10.5)
CO2: 25 mEq/L (ref 19–32)
CREATININE: 0.98 mg/dL (ref 0.50–1.10)
Chloride: 101 mEq/L (ref 96–112)
GFR calc Af Amer: 62 mL/min — ABNORMAL LOW (ref >90)
GFR, EST NON AFRICAN AMERICAN: 54 mL/min — AB (ref >90)
Glucose, Bld: 174 mg/dL — ABNORMAL HIGH (ref 70–99)
Potassium: 3.6 mEq/L — ABNORMAL LOW (ref 3.7–5.3)
SODIUM: 142 meq/L (ref 137–147)

## 2013-08-21 LAB — PRO B NATRIURETIC PEPTIDE: PRO B NATRI PEPTIDE: 212.6 pg/mL (ref 0–450)

## 2013-08-21 LAB — MAGNESIUM: Magnesium: 2.1 mg/dL (ref 1.5–2.5)

## 2013-08-21 MED ORDER — CENTRUM PO CHEW: 1.0000 | CHEWABLE_TABLET | Freq: Every day | ORAL | Status: DC

## 2013-08-21 MED ORDER — VITAMIN D3 25 MCG (1000 UNIT) PO TABS
1000.0000 [IU] | ORAL_TABLET | Freq: Every day | ORAL | Status: DC
  Administered 2013-08-22 – 2013-08-23 (×2): 1000 [IU] via ORAL
  Filled 2013-08-21 (×2): qty 1

## 2013-08-21 MED ORDER — ONDANSETRON HCL 4 MG/2ML IJ SOLN: 4.0000 mg | Freq: Four times a day (QID) | INTRAMUSCULAR | Status: DC | PRN

## 2013-08-21 MED ORDER — ACETAMINOPHEN 325 MG PO TABS: 650.0000 mg | ORAL_TABLET | ORAL | Status: DC | PRN

## 2013-08-21 MED ORDER — METOPROLOL SUCCINATE 12.5 MG HALF TABLET
12.5000 mg | ORAL_TABLET | Freq: Every day | ORAL | Status: DC
  Administered 2013-08-21: 12.5 mg via ORAL
  Filled 2013-08-21 (×2): qty 1

## 2013-08-21 MED ORDER — FLECAINIDE ACETATE 50 MG PO TABS
50.0000 mg | ORAL_TABLET | Freq: Two times a day (BID) | ORAL | Status: DC
  Administered 2013-08-21 – 2013-08-23 (×4): 50 mg via ORAL
  Filled 2013-08-21 (×5): qty 1

## 2013-08-21 MED ORDER — TRIAMTERENE-HCTZ 37.5-25 MG PO TABS
1.0000 | ORAL_TABLET | Freq: Every day | ORAL | Status: DC
  Administered 2013-08-22 – 2013-08-23 (×2): 1 via ORAL
  Filled 2013-08-21 (×2): qty 1

## 2013-08-21 MED ORDER — POTASSIUM CHLORIDE CRYS ER 20 MEQ PO TBCR
40.0000 meq | EXTENDED_RELEASE_TABLET | Freq: Once | ORAL | Status: AC
  Administered 2013-08-21: 40 meq via ORAL
  Filled 2013-08-21: qty 2

## 2013-08-21 MED ORDER — ATORVASTATIN CALCIUM 40 MG PO TABS
40.0000 mg | ORAL_TABLET | Freq: Every day | ORAL | Status: DC
  Administered 2013-08-21 – 2013-08-22 (×2): 40 mg via ORAL
  Filled 2013-08-21 (×3): qty 1

## 2013-08-21 MED ORDER — DABIGATRAN ETEXILATE MESYLATE 150 MG PO CAPS
150.0000 mg | ORAL_CAPSULE | Freq: Two times a day (BID) | ORAL | Status: DC
  Filled 2013-08-21 (×3): qty 1

## 2013-08-21 MED ORDER — ADULT MULTIVITAMIN W/MINERALS CH
1.0000 | ORAL_TABLET | Freq: Every day | ORAL | Status: DC
  Administered 2013-08-22 – 2013-08-23 (×2): 1 via ORAL
  Filled 2013-08-21 (×2): qty 1

## 2013-08-21 MED ORDER — METOPROLOL SUCCINATE 12.5 MG HALF TABLET: 25.0000 mg | ORAL_TABLET | Freq: Every day | ORAL | Status: DC

## 2013-08-21 MED ORDER — LEVOTHYROXINE SODIUM 88 MCG PO TABS
88.0000 ug | ORAL_TABLET | Freq: Every day | ORAL | Status: DC
  Administered 2013-08-22 – 2013-08-23 (×2): 88 ug via ORAL
  Filled 2013-08-21 (×3): qty 1

## 2013-08-21 MED ORDER — ATORVASTATIN CALCIUM 40 MG PO TABS: 40.0000 mg | ORAL_TABLET | Freq: Every day | ORAL | Status: DC

## 2013-08-21 MED ORDER — POTASSIUM CHLORIDE 20 MEQ PO PACK: 40.0000 meq | PACK | Freq: Once | ORAL | Status: DC

## 2013-08-21 NOTE — ED Notes (Addendum)
Pt reports 3 episodes today of "my heart beating funny" with dizziness, lightheadedness and chest tightness. Pt currently wearing Holter monitor which she states has alerted every time she has felt funny. Pt is scheduled for pacemaker placement on 5/15. Pt denies any chest pain at this time, but states " I just feel weak." Pt also reports mild SOB. Pt noted to be in A. Flutter.

## 2013-08-21 NOTE — Telephone Encounter (Signed)
[facility]  The patient had two pauses, 3.3 and 5.5 seconds.  The tech indicated they were not termination pauses.

## 2013-08-21 NOTE — ED Provider Notes (Signed)
CSN: [id number]     Arrival date & time 08/21/13  1801 History   First MD Initiated Contact with Patient 08/21/13 1824     Chief Complaint  Patient presents with  . Palpitations     (Consider location/radiation/quality/duration/timing/severity/associated sxs/prior Treatment) Patient is a 80 y.o. female presenting with palpitations. The history is provided by the patient.  Palpitations Palpitations quality:  Fast Onset quality:  Sudden Timing:  Sporadic Progression:  Worsening Chronicity:  Recurrent Context: not caffeine and not exercise   Relieved by:  Nothing Worsened by:  Nothing tried Associated symptoms: dizziness   Associated symptoms: no cough, no shortness of breath and no vomiting     Past Medical History  Diagnosis Date  . Hypertension   . Carotid artery occlusion   . Atrial fibrillation   . Stroke 04/20/11  . Hyperlipidemia   . Thyroid disease    Past Surgical History  Procedure Laterality Date  . Abdominal hysterectomy    . Cataract extraction, bilateral    . Appendectomy    . Carpal tunnel release      bilateral  . Tubal ligation    . Bladder tuck     Family History  Problem Relation Age of Onset  . Cancer Mother   . Heart disease Father   . Heart attack Father   . Other Brother     MS  . Multiple sclerosis Daughter    History  Substance Use Topics  . Smoking status: Never Smoker   . Smokeless tobacco: Never Used  . Alcohol Use: No   OB History   Grav Para Term Preterm Abortions TAB SAB Ect Mult Living                 Review of Systems  Constitutional: Negative for fever and chills.  Respiratory: Negative for cough and shortness of breath.   Cardiovascular: Positive for palpitations.  Gastrointestinal: Negative for vomiting and abdominal pain.  Neurological: Positive for dizziness.  All other systems reviewed and are negative.     Allergies  Review of patient's allergies indicates no known allergies.  Home Medications   Prior  to Admission medications   Medication Sig Start Date End Date Taking? Authorizing Provider  cholecalciferol (VITAMIN D) 1000 UNITS tablet Take 1,000 Units by mouth daily.   Yes Historical Provider, MD  flecainide (TAMBOCOR) 50 MG tablet Take 50 mg by mouth 2 (two) times daily.   Yes Historical Provider, MD  levothyroxine (SYNTHROID, LEVOTHROID) 88 MCG tablet Take 88 mcg by mouth daily.   Yes Historical Provider, MD  meclizine (ANTIVERT) 25 MG tablet Take 25 mg by mouth 3 (three) times daily as needed for dizziness.   Yes Historical Provider, MD  metoprolol succinate (TOPROL-XL) 25 MG 24 hr tablet Take 12.5-25 mg by mouth daily. Take a whole tablet in the morning and half in the evening   Yes Historical Provider, MD  multivitamin-iron-minerals-folic acid (CENTRUM) chewable tablet Chew 1 tablet by mouth daily.   Yes Historical Provider, MD  PRADAXA 150 MG CAPS Take 150 mg by mouth 2 (two) times daily. 11/23/11  Yes Historical Provider, MD  rosuvastatin (CRESTOR) 20 MG tablet Take 20 mg by mouth daily.   Yes Historical Provider, MD  triamterene-hydrochlorothiazide (MAXZIDE-25) 37.5-25 MG per tablet Take 1 tablet by mouth daily.   Yes Historical Provider, MD   BP 123/56  Pulse 119  Temp(Src) 98 F (36.7 C) (Oral)  Resp 21  SpO2 92% Physical Exam  Nursing note and vitals  reviewed. Constitutional: She is oriented to person, place, and time. She appears well-developed and well-nourished. No distress.  HENT:  Head: Normocephalic and atraumatic.  Mouth/Throat: Oropharynx is clear and moist.  Eyes: EOM are normal. Pupils are equal, round, and reactive to light.  Neck: Normal range of motion. Neck supple.  Cardiovascular: Regular rhythm.  Tachycardia present.  Exam reveals no friction rub.   No murmur heard. Pulmonary/Chest: Effort normal and breath sounds normal. No respiratory distress. She has no wheezes. She has no rales.  Abdominal: Soft. She exhibits no distension. There is no tenderness.  There is no rebound.  Musculoskeletal: Normal range of motion. She exhibits no edema.  Neurological: She is alert and oriented to person, place, and time.  Skin: She is not diaphoretic.    ED Course  Procedures (including critical care time) Labs Review Labs Reviewed  CBC - Abnormal; Notable for the following:    Hemoglobin 15.2 (*)    All other components within normal limits  BASIC METABOLIC PANEL - Abnormal; Notable for the following:    Potassium 3.6 (*)    Glucose, Bld 174 (*)    GFR calc non Af Amer 54 (*)    GFR calc Af Amer 62 (*)    Anion gap 16 (*)    All other components within normal limits  PRO B NATRIURETIC PEPTIDE  I-STAT TROPOININ, ED    Imaging Review Dg Chest Port 1 View  08/21/2013   CLINICAL DATA:  Cardiac palpitations and chest tightness  EXAM: PORTABLE CHEST - 1 VIEW  COMPARISON:  April 21, 2011  FINDINGS: There is no edema or consolidation. The heart size and pulmonary vascularity are normal. No adenopathy. No pneumothorax. No bone lesions.  IMPRESSION: No edema or consolidation.   Electronically Signed   By: Bretta Bang M.D.   On: 08/21/2013 18:53     EKG Interpretation   Date/Time:  Monday August 21 2013 18:08:49 EDT Ventricular Rate:  114 PR Interval:    QRS Duration: 72 QT Interval:  206 QTC Calculation: 283 R Axis:   0 Text Interpretation:  Atrial flutter with variable A-V block ST \\T \ T wave  abnormality, consider lateral ischemia Abnormal ECG flutter new Confirmed  by Gwendolyn Grant  MD, Devinn Hurwitz ([id number]) on 08/21/2013 6:23:53 PM      MDM   Final diagnoses:  Dizziness and giddiness    80 year old female with history of hypertension, A. fib presents with palpitations and dizziness. She is sitting on the couch and had a couple episodes of palpitations and dizziness with near-syncope. No chest pain or shortness of breath. No nausea or vomiting. She is scheduled for a pacer in 2 days with Dr. Ladona Ridgel. She is a patient of Dr. Donnie Aho. He put a heart  monitor on her for palpitations. Exam is benign. She does have tachycardia with rates in the 110s. Cardiology PA Lisabeth Devoid informed me the monitor company had called them because she had 2 causes earlier today. One is 3.3 and another was 5.5 seconds. With the sinus pauses and near-syncope, plan for admission. Cards admitting to Observation.  Dagmar Hait, MD 08/21/13 2013

## 2013-08-21 NOTE — H&P (Signed)
Patient ID: Tammy Wolf MRN: [id number], DOB/AGE: [birth year]   Admit date: 08/21/2013   Primary Physician: Ailene RavelHAMRICK,Tammy L, MD Primary Cardiologist: Dr. Donnie Wolf (primary card)/Dr Tammy Wolf (EP)  Pt. Profile:  [age] year old Caucasian female with past medical history of hypertension, history of carotid stenosis, hypothyroidism, history of TIA in 2011, and a long-standing history of paroxysmal atrial fibrillation on Pradaxa who was recently noted to have prolonged termination pauses presented with recurrent pauses on event monitor and presyncope spells  Problem List  Past Medical History  Diagnosis Date  . Hypertension   . Carotid artery occlusion   . Atrial fibrillation   . Stroke 04/20/11  . Hyperlipidemia   . Thyroid disease     Past Surgical History  Procedure Laterality Date  . Abdominal hysterectomy    . Cataract extraction, bilateral    . Appendectomy    . Carpal tunnel release      bilateral  . Tubal ligation    . Bladder tuck       Allergies  No Known Allergies  HPI  The patient is a 80 year old Caucasian female with past medical history of hypertension, history of carotid stenosis, hypothyroidism, history of TIA in 2011, and a long-standing history of paroxysmal atrial fibrillation on Pradaxa. Patient has been dealing with episodes of dizziness since last Christmas and was evaluated for inner ear problem and placed on meclizine. However she continued to have presyncopal spells despite meclizine. She was eventually referred back to Dr. Donnie Wolf who placed her on event monitor. While having the event monitor, patient was noted to have episodes of paroxysmal atrial fibrillation followed by pulses at  termination with the longest pulse lasting more than 6 seconds. She was referred to Dr. Ladona Wolf for further evaluation. Her last stress test was in June 2015 which was reportedly normal per patient, however no record is available. She was seen by Dr. Ladona Wolf on 08/16/2013 for  posttermination pulses. He eventually recommended pacemaker placement which has been tentatively scheduled for 08/23/2013. Since her last visit, patient states she continued to have fatigue problem. She has not been active recently. She denies any recent fever, chill, cough, lower extremity edema, orthopnea or paroxysmal nocturnal dyspnea.  In the afternoon of 08/21/2013, between 2 PM and 4 PM, she had 3 episodes of sudden onset of dizziness and presyncope. She described it as something slamming into the side of her head. She felt like she was going to pass out. She denies any nausea or vomiting. At the same time, her [name] monitor was alarming and telling her to call an 1800 number. Cardiology after hour answering service was paged by ecardio service informing the presence of at least 2 episodes of prolonged pauses. One lasting 3.3 seconds. The other one lasted 5.5 seconds. Interestingly, the pauses were not associated with termination this time. When the cardiology team contacted the patient, she states she has not been feeling well and [facility]. Cardiology saw the patient in the ED, and it was noted that her blood pressure was 140/81 on arrival. Heart rate was elevated at 117. O2 saturation 93% on room air. An EKG was obtained which showed atrial flutter with heart rate 114, nonspecific ST-T wave changes. Significant laboratory finding include potassium of 3.6, hemoglobin 15.2, Cr 0.98. Chest x-ray showed no acute abnormality.    Home Medications  Prior to Admission medications   Medication Sig Start Date End Date Taking? Authorizing Provider  cholecalciferol (  VITAMIN D) 1000 UNITS tablet Take 1,000 Units by mouth daily.   Yes Historical Provider, MD  flecainide (TAMBOCOR) 50 MG tablet Take 50 mg by mouth 2 (two) times daily.   Yes Historical Provider, MD  levothyroxine (SYNTHROID, LEVOTHROID) 88 MCG tablet Take 88 mcg by mouth daily.   Yes Historical  Provider, MD  meclizine (ANTIVERT) 25 MG tablet Take 25 mg by mouth 3 (three) times daily as needed for dizziness.   Yes Historical Provider, MD  metoprolol succinate (TOPROL-XL) 25 MG 24 hr tablet Take 12.5-25 mg by mouth daily. Take a whole tablet in the morning and half in the evening   Yes Historical Provider, MD  multivitamin-iron-minerals-folic acid (CENTRUM) chewable tablet Chew 1 tablet by mouth daily.   Yes Historical Provider, MD  PRADAXA 150 MG CAPS Take 150 mg by mouth 2 (two) times daily. 11/23/11  Yes Historical Provider, MD  rosuvastatin (CRESTOR) 20 MG tablet Take 20 mg by mouth daily.   Yes Historical Provider, MD  triamterene-hydrochlorothiazide (MAXZIDE-25) 37.5-25 MG per tablet Take 1 tablet by mouth daily.   Yes Historical Provider, MD    Family History  Family History  Problem Relation Age of Onset  . Cancer Mother   . Heart disease Father   . Heart attack Father   . Other Brother     MS  . Multiple sclerosis Daughter     Social History  History   Social History  . Marital Status: Married    Spouse Name: N/A    Number of Children: 4  . Years of Education: HS   Occupational History  . Retired    Social History Main Topics  . Smoking status: Never Smoker   . Smokeless tobacco: Never Used  . Alcohol Use: No  . Drug Use: No  . Sexual Activity: Not on file   Other Topics Concern  . Not on file   Social History Narrative   Patient is married    Patient lives at home with spouse.   Caffeine use:     Review of Systems General:  No fever, night sweats or weight changes. +chill Cardiovascular:  No dyspnea on exertion, edema, orthopnea, palpitations, paroxysmal nocturnal dyspnea. +palpitation after presyncope, otherwise no palpitation or cardiac awareness of a-flutter with RVR Dermatological: No rash, lesions/masses Respiratory: No cough, dyspnea Urologic: No hematuria, dysuria Abdominal:   No nausea, vomiting, diarrhea, bright red blood per rectum,  melena, or hematemesis Neurologic:  No visual changes, changes in mental status. +fatigue, dizziness All other systems reviewed and are otherwise negative except as noted above.  Physical Exam  Blood pressure 123/56, pulse 119, temperature 98 F (36.7 C), temperature source Oral, resp. rate 21, SpO2 92.00%.  General: Pleasant, NAD Psych: Normal affect. Neuro: Alert and oriented X 3. Moves all extremities spontaneously. HEENT: Normal  Neck: Supple without bruits or JVD. Lungs:  Resp regular and unlabored, CTA. Heart: tachycardic no s3, s4, or murmurs. Abdomen: Soft, non-tender, non-distended, BS + x 4.  Extremities: No clubbing, cyanosis or edema. DP/PT/Radials 2+ and equal bilaterally.  Labs  Troponin [facility] Care Test)  Recent Labs  08/21/13 1845  TROPIPOC 0.01    Lab Results  Component Value Date   WBC 7.9 08/21/2013   HGB 15.2* 08/21/2013   HCT 45.0 08/21/2013   MCV 97.4 08/21/2013   PLT 292 08/21/2013    Recent Labs Lab 08/21/13 1819  NA 142  K 3.6*  CL 101  CO2 25  BUN 13  CREATININE  0.98  CALCIUM 9.7  GLUCOSE 174*    Radiology/Studies  Mr Sherrin Daisy Contrast  08/06/2013   CLINICAL DATA:  36 -year-old female with dizziness. Initial encounter.  EXAM: MRI HEAD WITHOUT CONTRAST  TECHNIQUE: Multiplanar, multiecho pulse sequences of the brain and surrounding structures were obtained without intravenous contrast.  COMPARISON:  Brain MRI 03/25/2012 and earlier.  FINDINGS: Stable cerebral volume since 2013. Major intracranial vascular flow voids are stable. No restricted diffusion to suggest acute infarction. No midline shift, mass effect, evidence of mass lesion, ventriculomegaly, extra-axial collection or acute intracranial hemorrhage. Cervicomedullary junction and pituitary are within normal limits. Stable visualized cervical spine. Chronic cerebral white matter T2 and FLAIR hyperintensity, patchy and confluent in areas, in a nonspecific configuration. Stable  to mild progression since 2013. No cortical encephalomalacia. Deep gray matter nuclei, brainstem and cerebellum remain within normal limits. Visualized internal auditory structures appear stable and normal. Mastoids are clear.  Chronic left sphenoid sinus inflammatory changes and subtotal opacification. Stable paranasal sinuses since 2013. Stable orbits soft tissues. Visualized scalp soft tissues are within normal limits.  IMPRESSION: No acute intracranial abnormality. Stable to mildly increased nonspecific white matter signal changes since 2013, most commonly due to chronic small vessel disease.   Electronically Signed   By: Augusto Gamble M.D.   On: 08/06/2013 12:07   Dg Chest Port 1 View  08/21/2013   CLINICAL DATA:  Cardiac palpitations and chest tightness  EXAM: PORTABLE CHEST - 1 VIEW  COMPARISON:  April 21, 2011  FINDINGS: There is no edema or consolidation. The heart size and pulmonary vascularity are normal. No adenopathy. No pneumothorax. No bone lesions.  IMPRESSION: No edema or consolidation.   Electronically Signed   By: Bretta Bang M.D.   On: 08/21/2013 18:53    ECG  EKG atrial flutter with heart rate 114, nonspecific ST-T wave changes  ASSESSMENT AND PLAN  1. Sinus pauses  - recently had sinus pauses associated with episodes of PAF with post termination pause  - had at 2 episodes of symptomatic pauses on 7/13, not associated with termination this time  - discussed with Dr. Patty Sermons, will continue low dose metoprolol  - will not add other rate control medication at this time given recent prolonged pauses while her vital is stable.    - verify that her June 2015 stress test result is indeed normal   - EP service to see in AM for possible pacemaker, after which, she can be placed on stronger rate control med without worry about pauses. EP to decide if she can have pacemaker while on pradaxa.  2. Atrial flutter with RVR  - will continue flecainide and low dose BB  - will allow  tachycardia for now as long as vital is stable, would not add further rate control med given recent sinus pauses  3. Hypokalemia  - replete K, check Mg  - keep K >4.0 and Mg >2.0  4. Hypertension 5. history of carotid stenosis 6. Hypothyroidism 7. history of TIA in 2011  Hassan Buckler 08/21/2013, 7:31 PM  The patient was seen in the emergency room with Mr. Lisabeth Devoid, [place].  She is being admitted after experiencing 2 prolonged pauses on her event monitor.  She is not aware of her rapid heart action.  However her pauses were associated with episodes of sudden dizziness and near syncope today.  The patient has already been scheduled for pacemaker implantation which was to have occurred on Wednesday, July 15 by Dr. Ladona Ridgel.  She is admitted now because of the episodes that occurred earlier today.  He has not been experiencing any chest pain.  Her present EKG was reviewed and shows atrial flutter with controlled ventricular response 119 per minute.  She is currently asymptomatic. We will continue low-dose beta blocker and continue flecainide.  The patient had a recent Lexi scan Myoview stress test at Dr. York Spaniel office which was reportedly normal. We will ask EP to see in a.m.

## 2013-08-21 NOTE — Progress Notes (Signed)
MD on call, Dr. Zachery ConchFriedman, gave order to hold Pradaxa both tonight and in the morning until after EP has seen the patient, in case the pt needs to have a Pacemaker placed.

## 2013-08-22 ENCOUNTER — Ambulatory Visit (HOSPITAL_COMMUNITY): Admission: RE | Admit: 2013-08-22 | Payer: Medicare Other | Source: Ambulatory Visit | Admitting: Internal Medicine

## 2013-08-22 ENCOUNTER — Encounter (HOSPITAL_COMMUNITY)
Admission: EM | Disposition: A | Discharge status: Home / Self Care | Payer: Medicare Other | Source: Home / Self Care | Attending: Cardiology

## 2013-08-22 DIAGNOSIS — I4891 Unspecified atrial fibrillation: Principal | ICD-10-CM

## 2013-08-22 DIAGNOSIS — Z8673 Personal history of transient ischemic attack (TIA), and cerebral infarction without residual deficits: Secondary | ICD-10-CM | POA: Diagnosis not present

## 2013-08-22 DIAGNOSIS — Z8249 Family history of ischemic heart disease and other diseases of the circulatory system: Secondary | ICD-10-CM | POA: Diagnosis not present

## 2013-08-22 DIAGNOSIS — I4892 Unspecified atrial flutter: Secondary | ICD-10-CM | POA: Diagnosis present

## 2013-08-22 DIAGNOSIS — I6529 Occlusion and stenosis of unspecified carotid artery: Secondary | ICD-10-CM | POA: Diagnosis present

## 2013-08-22 DIAGNOSIS — I455 Other specified heart block: Secondary | ICD-10-CM | POA: Diagnosis present

## 2013-08-22 DIAGNOSIS — I495 Sick sinus syndrome: Secondary | ICD-10-CM

## 2013-08-22 DIAGNOSIS — Z7901 Long term (current) use of anticoagulants: Secondary | ICD-10-CM | POA: Diagnosis not present

## 2013-08-22 DIAGNOSIS — E876 Hypokalemia: Secondary | ICD-10-CM | POA: Diagnosis present

## 2013-08-22 DIAGNOSIS — I10 Essential (primary) hypertension: Secondary | ICD-10-CM | POA: Diagnosis present

## 2013-08-22 DIAGNOSIS — E785 Hyperlipidemia, unspecified: Secondary | ICD-10-CM | POA: Diagnosis present

## 2013-08-22 DIAGNOSIS — R000 Tachycardia, unspecified: Secondary | ICD-10-CM | POA: Diagnosis present

## 2013-08-22 DIAGNOSIS — E039 Hypothyroidism, unspecified: Secondary | ICD-10-CM | POA: Diagnosis present

## 2013-08-22 DIAGNOSIS — R42 Dizziness and giddiness: Secondary | ICD-10-CM | POA: Diagnosis present

## 2013-08-22 HISTORY — PX: PACEMAKER INSERTION: SHX728

## 2013-08-22 HISTORY — PX: PERMANENT PACEMAKER INSERTION: SHX5480

## 2013-08-22 LAB — BASIC METABOLIC PANEL
Anion gap: 15 (ref 5–15)
BUN: 14 mg/dL (ref 6–23)
CO2: 25 mEq/L (ref 19–32)
Calcium: 9.4 mg/dL (ref 8.4–10.5)
Chloride: 103 mEq/L (ref 96–112)
Creatinine, Ser: 0.98 mg/dL (ref 0.50–1.10)
GFR calc Af Amer: 62 mL/min — ABNORMAL LOW (ref >90)
GFR, EST NON AFRICAN AMERICAN: 54 mL/min — AB (ref >90)
Glucose, Bld: 100 mg/dL — ABNORMAL HIGH (ref 70–99)
POTASSIUM: 4 meq/L (ref 3.7–5.3)
SODIUM: 143 meq/L (ref 137–147)

## 2013-08-22 LAB — LIPID PANEL
Cholesterol: 163 mg/dL (ref 0–200)
HDL: 72 mg/dL (ref >39)
LDL CALC: 72 mg/dL (ref 0–99)
TRIGLYCERIDES: 95 mg/dL (ref <150)
Total CHOL/HDL Ratio: 2.3 RATIO
VLDL: 19 mg/dL (ref 0–40)

## 2013-08-22 LAB — TROPONIN I: Troponin I: <0.3 ng/mL (ref <0.30)

## 2013-08-22 LAB — SURGICAL PCR SCREEN
MRSA, PCR: POSITIVE — AB
Staphylococcus aureus: POSITIVE — AB

## 2013-08-22 LAB — TSH: TSH: 3.13 u[IU]/mL (ref 0.350–4.500)

## 2013-08-22 SURGERY — PERMANENT PACEMAKER INSERTION
Anesthesia: LOCAL

## 2013-08-22 MED ORDER — SODIUM CHLORIDE 0.9 % IV SOLN
INTRAVENOUS | Status: DC
  Administered 2013-08-22: 14:00:00 via INTRAVENOUS

## 2013-08-22 MED ORDER — SODIUM CHLORIDE 0.9 % IR SOLN: 80.0000 mg | Status: DC

## 2013-08-22 MED ORDER — CHLORHEXIDINE GLUCONATE 4 % EX LIQD: 60.0000 mL | Freq: Once | CUTANEOUS | Status: DC

## 2013-08-22 MED ORDER — ACETAMINOPHEN 325 MG PO TABS: 325.0000 mg | ORAL_TABLET | ORAL | Status: DC | PRN

## 2013-08-22 MED ORDER — ONDANSETRON HCL 4 MG/2ML IJ SOLN: 4.0000 mg | Freq: Four times a day (QID) | INTRAMUSCULAR | Status: DC | PRN

## 2013-08-22 MED ORDER — MUPIROCIN 2 % EX OINT
TOPICAL_OINTMENT | Freq: Two times a day (BID) | CUTANEOUS | Status: DC
  Administered 2013-08-22 – 2013-08-23 (×2): via NASAL
  Filled 2013-08-22: qty 22

## 2013-08-22 MED ORDER — METOPROLOL TARTRATE 1 MG/ML IV SOLN
2.5000 mg | Freq: Once | INTRAVENOUS | Status: AC
  Administered 2013-08-22: 2.5 mg via INTRAVENOUS
  Filled 2013-08-22: qty 5

## 2013-08-22 MED ORDER — CEFAZOLIN SODIUM 1-5 GM-% IV SOLN
1.0000 g | Freq: Four times a day (QID) | INTRAVENOUS | Status: AC
  Administered 2013-08-22 – 2013-08-23 (×3): 1 g via INTRAVENOUS
  Filled 2013-08-22 (×3): qty 50

## 2013-08-22 MED ORDER — SODIUM CHLORIDE 0.9 % IV SOLN: 250.0000 mL | INTRAVENOUS | Status: DC | PRN

## 2013-08-22 MED ORDER — METOPROLOL TARTRATE 50 MG PO TABS
50.0000 mg | ORAL_TABLET | Freq: Two times a day (BID) | ORAL | Status: DC
  Administered 2013-08-22: 50 mg via ORAL
  Filled 2013-08-22 (×3): qty 1

## 2013-08-22 MED ORDER — HYDROCODONE-ACETAMINOPHEN 5-325 MG PO TABS
1.0000 | ORAL_TABLET | ORAL | Status: DC | PRN
  Administered 2013-08-22: 1 via ORAL
  Filled 2013-08-22: qty 1

## 2013-08-22 MED ORDER — SODIUM CHLORIDE 0.9 % IJ SOLN: 3.0000 mL | INTRAMUSCULAR | Status: DC | PRN

## 2013-08-22 MED ORDER — SODIUM CHLORIDE 0.9 % IV SOLN: INTRAVENOUS | Status: DC

## 2013-08-22 MED ORDER — LIDOCAINE HCL (PF) 1 % IJ SOLN
INTRAMUSCULAR | Status: AC
  Filled 2013-08-22: qty 60

## 2013-08-22 MED ORDER — CEFAZOLIN SODIUM-DEXTROSE 2-3 GM-% IV SOLR: 2.0000 g | INTRAVENOUS | Status: DC

## 2013-08-22 MED ORDER — CHLORHEXIDINE GLUCONATE 4 % EX LIQD
60.0000 mL | Freq: Once | CUTANEOUS | Status: DC
  Filled 2013-08-22: qty 60

## 2013-08-22 MED ORDER — SODIUM CHLORIDE 0.9 % IJ SOLN: 3.0000 mL | Freq: Two times a day (BID) | INTRAMUSCULAR | Status: DC

## 2013-08-22 MED ORDER — FENTANYL CITRATE 0.05 MG/ML IJ SOLN
INTRAMUSCULAR | Status: AC
  Filled 2013-08-22: qty 2

## 2013-08-22 MED ORDER — MIDAZOLAM HCL 5 MG/5ML IJ SOLN
INTRAMUSCULAR | Status: AC
  Filled 2013-08-22: qty 5

## 2013-08-22 MED ORDER — CEFAZOLIN SODIUM-DEXTROSE 2-3 GM-% IV SOLR
2.0000 g | INTRAVENOUS | Status: DC
Start: 2013-08-22 — End: 2013-08-22
  Filled 2013-08-22: qty 50

## 2013-08-22 MED ORDER — YOU HAVE A PACEMAKER BOOK
Freq: Once | Status: AC
  Administered 2013-08-22: 23:00:00
  Filled 2013-08-22: qty 1

## 2013-08-22 MED ORDER — SODIUM CHLORIDE 0.9 % IR SOLN
80.0000 mg | Status: DC
  Filled 2013-08-22: qty 2

## 2013-08-22 NOTE — Op Note (Signed)
SURGEON:  Hillis RangeJames Markitta Ausburn, MD     PREPROCEDURE DIAGNOSIS:  Symptomatic tachycardia/ bradycardia syndrome    POSTPROCEDURE DIAGNOSIS:  Symptomatic tachycardia/ bradycardia syndrome     PROCEDURES:   1. Pacemaker implantation.     INTRODUCTION: Donovan KailJanice K Barnier is a 80 y.o. female  with a history of atrial fibrillation and tachycardia/ bradycardia syndrome who presents today for pacemaker implantation.  The patient reports intermittent episodes of dizziness over the past few weeks.  She is found to have multiple pauses of > 4 seconds.  She also has afib with RVR for which she required rate controlling medicines long term.  No reversible causes have been identified.  The patient therefore presents today for pacemaker implantation.     DESCRIPTION OF PROCEDURE:  Informed written consent was obtained, and the patient was brought to the electrophysiology lab in a fasting state.  The patient received IV Versed and Fentanyl as sedation for the procedure today.  The patients left chest was prepped and draped in the usual sterile fashion by the EP lab staff. The skin overlying the left deltopectoral region was infiltrated with lidocaine for local analgesia.  A 4-cm incision was made over the left deltopectoral region.  A left subcutaneous pacemaker pocket was fashioned using a combination of sharp and blunt dissection. Electrocautery was required to assure hemostasis.    RA/RV Lead Placement: The left axillary vein was therefore cannulated.  Through the left axillary vein, a Medtronic model [id number] (serial number [id number]) right atrial lead and a Medtronic model 5092- 58 (serial number [id number]) right ventricular lead were advanced with fluoroscopic visualization into the right atrial appendage and right ventricular apex positions respectively.  Initial atrial lead P- waves measured 4.5 mV with impedance of 773 ohms and a threshold of 1.3 V at 0.5 msec.  Right ventricular lead R-waves measured 14 mV with an  impedance of 772 ohms and a threshold of 0.5 V at 0.5 msec.  Both leads were secured to the pectoralis fascia using #2-0 silk over the suture sleeves.  The patient was observed to have diaphragmatic stimulation when pacing from the RV at 8 V @ 0.5 msec.  The lead was repositioned several times and this did not improve.  There was no diaphragmatic stimulation when pacing at < 8V @ 0.5 msec.  Device Placement:  The leads were then connected to a Medtronic Adapta L model ADDRL 1 (serial number [id number]) pacemaker.  The pocket was irrigated with copious gentamicin solution.  The pacemaker was then placed into the pocket.  The pocket was then closed in 2 layers with 2.0 Vicryl suture for the subcutaneous and subcuticular layers.  Steri-  Strips and a sterile dressing were then applied.  There were no early apparent complications.     CONCLUSIONS:   1. Successful implantation of a Medtronic Adapta L dual-chamber pacemaker for tachycardia/ bradycardia syndrome  2. No early apparent complications.           Hillis RangeJames Mahitha Hickling, MD 08/22/2013 4:46 PM

## 2013-08-22 NOTE — Significant Event (Signed)
Cross Cover Note  6.4s pause w/ feelings of presyncope. Tele review demonstrated a post conversion pause.   Given duration of pause and fact that she is asymptomatic when tachycardic will plan to d/c metoprolol while awaiting PPM.

## 2013-08-22 NOTE — Interval H&P Note (Signed)
History and Physical Interval Note:  08/22/2013 2:57 PM  Tammy Wolf  has presented today for surgery, with the diagnosis of Afib  The various methods of treatment have been discussed with the patient and family. After consideration of risks, benefits and other options for treatment, the patient has consented to  Procedure(s): PERMANENT PACEMAKER INSERTION (N/A) as a surgical intervention .  The patient's history has been reviewed, patient examined, no change in status, stable for surgery.  I have reviewed the patient's chart and labs.  Questions were answered to the patient's satisfaction.     Hillis RangeJames Cire Clute

## 2013-08-22 NOTE — Progress Notes (Signed)
At 0230, pt had a 7.12 second pause followed by several minutes of bradycardia.  Pt reported similar symptoms that occurred during the previous pause, including lightheadedness and feeling like she was hit on the left side of the head.  MD on call, Dr. Zachery ConchFriedman, notified.  No new orders at this time.  Will continue to monitor.

## 2013-08-22 NOTE — Progress Notes (Signed)
At about 0045, telemetry showed a 6.39 second pause followed by several minutes of bradycardia. Pt reported feeling lightheaded and like she had been hit on the left side of her head.  Pt denies palpitations, chest pain, or chest pressure.  Pt heart rate returned to baseline in the 120s and pt now reports feeling asymptomatic.  MD on call, Dr. Zachery ConchFriedman, notified and will come up to look at the strips.  Will continue to monitor.

## 2013-08-22 NOTE — Progress Notes (Signed)
Courtesy visit.  I need to see in 2 weeks.  Darden PalmerW. Spencer Ermel Verne, Jr. MD [facility]

## 2013-08-22 NOTE — Progress Notes (Signed)
At 0316 the pt had a 7.85 second pause followed by several minutes of bradycardia and then normal sinus rhythm in the 60's. Pt asleep at the time and does not recall feeling abnormal.  MD on call, Dr. Zachery ConchFriedman, notified.  No new orders at this time.  Will continue to monitor.

## 2013-08-22 NOTE — Progress Notes (Signed)
ELECTROPHYSIOLOGY ROUNDING NOTE    Patient Name: Tammy Wolf Date of Encounter: 08/22/2013    SUBJECTIVE:Patient feels well this morning.  Pre-syncope yesterday with pauses. No chest pain or shortness of breath.   TELEMETRY: Reviewed telemetry pt in afib with RVR and up to 7 seconds of post termination pauses Filed Vitals:   08/21/13 2100 08/21/13 2130 08/21/13 2210 08/22/13 0503  BP: 148/66 139/84 134/63 120/51  Pulse: 132 116 118 63  Temp:   98 F (36.7 C) 98.2 F (36.8 C)  TempSrc:   Oral Oral  Resp: 19  20 20   Height:   5' (1.524 m)   Weight:   136 lb 11.2 oz (62.007 kg) 138 lb 6.4 oz (62.778 kg)  SpO2: 96% 95% 96% 95%    Intake/Output Summary (Last 24 hours) at 08/22/13 0716 Last data filed at 08/22/13 0548  Gross per 24 hour  Intake    150 ml  Output      0 ml  Net    150 ml    CURRENT MEDICATIONS: . atorvastatin  40 mg Oral q1800  . cholecalciferol  1,000 Units Oral Daily  . dabigatran  150 mg Oral BID  . flecainide  50 mg Oral BID  . levothyroxine  88 mcg Oral QAC breakfast  . multivitamin with minerals  1 tablet Oral Daily  . triamterene-hydrochlorothiazide  1 tablet Oral Daily    LABS: Basic Metabolic Panel:  Recent Labs  16/11/9605/13/15 1819 08/21/13 2315 08/22/13 0430  NA 142  --  143  K 3.6*  --  4.0  CL 101  --  103  CO2 25  --  25  GLUCOSE 174*  --  100*  BUN 13  --  14  CREATININE 0.98  --  0.98  CALCIUM 9.7  --  9.4  MG  --  2.1  --    CBC:  Recent Labs  08/21/13 1819  WBC 7.9  HGB 15.2*  HCT 45.0  MCV 97.4  PLT 292   Cardiac Enzymes:  Recent Labs  08/21/13 2315 08/22/13 0430  TROPONINI <0.30 <0.30   Fasting Lipid Panel:  Recent Labs  08/22/13 0430  CHOL 163  HDL 72  LDLCALC 72  TRIG 95  CHOLHDL 2.3   Thyroid Function Tests:  Recent Labs  08/22/13 0430  TSH 3.130     Radiology/Studies:  Dg Chest Port 1 View 08/21/2013   CLINICAL DATA:  Cardiac palpitations and chest tightness  EXAM: PORTABLE CHEST - 1  VIEW  COMPARISON:  April 21, 2011  FINDINGS: There is no edema or consolidation. The heart size and pulmonary vascularity are normal. No adenopathy. No pneumothorax. No bone lesions.  IMPRESSION: No edema or consolidation.   Electronically Signed   By: Bretta BangWilliam  Woodruff M.D.   On: 08/21/2013 18:53    PHYSICAL EXAM Well appearing 80 yo woman, NAD HEENT: Unremarkable,[place], AT Neck:  6 JVD, no thyromegally Back:  No CVA tenderness Lungs:  Clear with no wheezes, rales, or rhonchi HEART:  Regular rate rhythm, no murmurs, no rubs, no clicks Abd:  soft, positive bowel sounds, no organomegally, no rebound, no guarding Ext:  2 plus pulses, no edema, no cyanosis, no clubbing Skin:  No rashes no nodules Neuro:  CN II through XII intact, motor grossly intact    Active Problems:   Sinus pause  PAF/Flutter HTN Sinus node dysfunction Rec: The patient had been scheduled for PPM by me for tomorrow. We will try and get  this done today with Dr. Johney Frame performing the procedure. She wishes to proceed.  Leonia Reeves.D.

## 2013-08-22 NOTE — H&P (View-Only) (Signed)
ELECTROPHYSIOLOGY ROUNDING NOTE    Patient Name: Tammy Wolf Date of Encounter: 08/22/2013    SUBJECTIVE:Patient feels well this morning.  Pre-syncope yesterday with pauses. No chest pain or shortness of breath.   TELEMETRY: Reviewed telemetry pt in afib with RVR and up to 7 seconds of post termination pauses Filed Vitals:   08/21/13 2100 08/21/13 2130 08/21/13 2210 08/22/13 0503  BP: 148/66 139/84 134/63 120/51  Pulse: 132 116 118 63  Temp:   98 F (36.7 C) 98.2 F (36.8 C)  TempSrc:   Oral Oral  Resp: 19  20 20   Height:   5' (1.524 m)   Weight:   136 lb 11.2 oz (62.007 kg) 138 lb 6.4 oz (62.778 kg)  SpO2: 96% 95% 96% 95%    Intake/Output Summary (Last 24 hours) at 08/22/13 0716 Last data filed at 08/22/13 0548  Gross per 24 hour  Intake    150 ml  Output      0 ml  Net    150 ml    CURRENT MEDICATIONS: . atorvastatin  40 mg Oral q1800  . cholecalciferol  1,000 Units Oral Daily  . dabigatran  150 mg Oral BID  . flecainide  50 mg Oral BID  . levothyroxine  88 mcg Oral QAC breakfast  . multivitamin with minerals  1 tablet Oral Daily  . triamterene-hydrochlorothiazide  1 tablet Oral Daily    LABS: Basic Metabolic Panel:  Recent Labs  16/11/9605/13/15 1819 08/21/13 2315 08/22/13 0430  NA 142  --  143  K 3.6*  --  4.0  CL 101  --  103  CO2 25  --  25  GLUCOSE 174*  --  100*  BUN 13  --  14  CREATININE 0.98  --  0.98  CALCIUM 9.7  --  9.4  MG  --  2.1  --    CBC:  Recent Labs  08/21/13 1819  WBC 7.9  HGB 15.2*  HCT 45.0  MCV 97.4  PLT 292   Cardiac Enzymes:  Recent Labs  08/21/13 2315 08/22/13 0430  TROPONINI <0.30 <0.30   Fasting Lipid Panel:  Recent Labs  08/22/13 0430  CHOL 163  HDL 72  LDLCALC 72  TRIG 95  CHOLHDL 2.3   Thyroid Function Tests:  Recent Labs  08/22/13 0430  TSH 3.130     Radiology/Studies:  Dg Chest Port 1 View 08/21/2013   CLINICAL DATA:  Cardiac palpitations and chest tightness  EXAM: PORTABLE CHEST - 1  VIEW  COMPARISON:  April 21, 2011  FINDINGS: There is no edema or consolidation. The heart size and pulmonary vascularity are normal. No adenopathy. No pneumothorax. No bone lesions.  IMPRESSION: No edema or consolidation.   Electronically Signed   By: Bretta BangWilliam  Woodruff M.D.   On: 08/21/2013 18:53    PHYSICAL EXAM Well appearing 80 yo woman, NAD HEENT: Unremarkable,[place], AT Neck:  6 JVD, no thyromegally Back:  No CVA tenderness Lungs:  Clear with no wheezes, rales, or rhonchi HEART:  Regular rate rhythm, no murmurs, no rubs, no clicks Abd:  soft, positive bowel sounds, no organomegally, no rebound, no guarding Ext:  2 plus pulses, no edema, no cyanosis, no clubbing Skin:  No rashes no nodules Neuro:  CN II through XII intact, motor grossly intact    Active Problems:   Sinus pause  PAF/Flutter HTN Sinus node dysfunction Rec: The patient had been scheduled for PPM by me for tomorrow. We will try and get  this done today with Dr. Johney Frame performing the procedure. She wishes to proceed.  Leonia Reeves.D.

## 2013-08-23 ENCOUNTER — Inpatient Hospital Stay (HOSPITAL_COMMUNITY): Payer: Medicare Other

## 2013-08-23 ENCOUNTER — Telehealth: Payer: Self-pay | Admitting: Cardiology

## 2013-08-23 MED ORDER — METOPROLOL TARTRATE 50 MG PO TABS: 75.0000 mg | ORAL_TABLET | Freq: Two times a day (BID) | ORAL | Status: DC

## 2013-08-23 MED ORDER — HYDROCODONE-ACETAMINOPHEN 5-325 MG PO TABS: 1.0000 | ORAL_TABLET | ORAL | Status: AC | PRN

## 2013-08-23 MED ORDER — METOPROLOL SUCCINATE ER 50 MG PO TB24: 50.0000 mg | ORAL_TABLET | Freq: Every day | ORAL | Status: DC

## 2013-08-23 MED ORDER — METOPROLOL SUCCINATE ER 50 MG PO TB24: 50.0000 mg | ORAL_TABLET | Freq: Two times a day (BID) | ORAL | Status: DC

## 2013-08-23 MED ORDER — FLECAINIDE ACETATE 50 MG PO TABS: 75.0000 mg | ORAL_TABLET | Freq: Two times a day (BID) | ORAL | Status: DC

## 2013-08-23 MED ORDER — METOPROLOL SUCCINATE ER 50 MG PO TB24
50.0000 mg | ORAL_TABLET | Freq: Two times a day (BID) | ORAL | Status: DC
  Administered 2013-08-23: 50 mg via ORAL
  Filled 2013-08-23 (×3): qty 1

## 2013-08-23 NOTE — Progress Notes (Signed)
Approximately 2000, pt HR intermittently increasing to 130's, normal sinus rhythm, non-sustained.  Pt reported feeling dizzy and weak at the time HR increased.  Pt given Metoprolol 50mg  tablet, as ordered by Dr. Donnie Ahoilley.  Around 2100, pt HR sustained in 130's, normal sinus rhythm, with intermittent increases to 150's - 160's.  Pt again reporting dizziness and weakness.  MD on call, Dr. Donnetta SimpersYousef, notified.  Metoprolol 2.5mg  IV ordered and given.  At 0015, Pt now resting comfortably with HR less than 110.  Will continue to monitor.

## 2013-08-23 NOTE — Telephone Encounter (Signed)
Need clarification her Toprol XC prescriptions,they were received today.

## 2013-08-23 NOTE — Telephone Encounter (Signed)
Patient currently being discharged from hospital and two different Rx were sent in for same medication. Informed pharmacy that I was unsure due to patient and Vernona RiegerLaura being in hospital and not here, pharmacy voiced understanding and stated they would call hospital.

## 2013-08-23 NOTE — Discharge Instructions (Signed)
Heart healthy diet     Supplemental Discharge Instructions for  Pacemaker Patients  Activity No heavy lifting or vigorous activity with your left arm for 6 to 8 weeks.  Do not raise your left arm above your head for one week.  Gradually raise your affected arm as drawn below.            _7/17/15_                             08/25/13                    09/05/13                  09/05/13    NO DRIVING for  1 week   ; you may begin driving on   0/98/117/23/15  .  WOUND CARE   Keep the wound area clean and dry.  Do not get this area wet for one week. No showers for one week; you may shower on     .   The tape/steri-strips on your wound will fall off; do not pull them off.  No bandage is needed on the site.  DO  NOT apply any creams, oils, or ointments to the wound area.   If you notice any drainage or discharge from the wound, any swelling or bruising at the site, or you develop a fever > 101? F after you are discharged home, call the office at once.  Special Instructions   You are still able to use cellular telephones; use the ear opposite the side where you have your pacemaker/defibrillator.  Avoid carrying your cellular phone near your device.   When traveling through airports, show security personnel your identification card to avoid being screened in the metal detectors.  Ask the security personnel to use the hand wand.   Avoid arc welding equipment, MRI testing (magnetic resonance imaging), TENS units (transcutaneous nerve stimulators).  Call the office for questions about other devices.   Avoid electrical appliances that are in poor condition or are not properly grounded.   Microwave ovens are safe to be near or to operate.

## 2013-08-23 NOTE — Discharge Summary (Signed)
Physician Discharge Summary      ADDENDUM TO DISCHARGE SUMMARY:  pt had medication changes prior to discharge Patient ID: Tammy KailJanice K Rondon MRN: [id number] DOB/AGE: [birth year] 78 y.o.  Admit date: 08/21/2013 Discharge date: 08/23/2013  Disposition: 01-Home or Self Care     Medication List    STOP taking these medications       metoprolol succinate 25 MG 24 hr tablet  Commonly known as:  TOPROL-XL      TAKE these medications       cholecalciferol 1000 UNITS tablet  Commonly known as:  VITAMIN D  Take 1,000 Units by mouth daily.     flecainide 50 MG tablet  Commonly known as:  TAMBOCOR  Take 1.5 tablets (75 mg total) by mouth 2 (two) times daily.     HYDROcodone-acetaminophen 5-325 MG per tablet  Commonly known as:  NORCO/VICODIN  Take 1-2 tablets by mouth every 4 (four) hours as needed for moderate pain.     levothyroxine 88 MCG tablet  Commonly known as:  SYNTHROID, LEVOTHROID  Take 88 mcg by mouth daily.     meclizine 25 MG tablet  Commonly known as:  ANTIVERT  Take 25 mg by mouth 3 (three) times daily as needed for dizziness.     metoprolol 50 MG tablet  Commonly known as:  LOPRESSOR  Take 1.5 tablets (75 mg total) by mouth 2 (two) times daily.     multivitamin-iron-minerals-folic acid chewable tablet  Chew 1 tablet by mouth daily.     PRADAXA 150 MG Caps capsule  Generic drug:  dabigatran  Take 150 mg by mouth 2 (two) times daily.     rosuvastatin 20 MG tablet  Commonly known as:  CRESTOR  Take 20 mg by mouth daily.     triamterene-hydrochlorothiazide 37.5-25 MG per tablet  Commonly known as:  MAXZIDE-25  Take 1 tablet by mouth daily.           Follow-up Information   Follow up with Lewayne BuntingGregg Taylor, MD On 09/04/2013. (for device clinic to check pacer site at 0900)    Specialty:  Cardiology   Contact information:   [address] [place] [address] [phone]       Follow up with Lewayne BuntingGregg Taylor, MD. (you should see him in 3  months, the office should call you in next few weeks to give appt, but if not heard by 2nd week of August then call them )    Specialty:  Cardiology   Contact information:   [address] [place] [address] [phone]       Follow up with Darden PalmerILLEY JR,W SPENCER, MD On 09/08/2013. (at 9:45AM)    Specialty:  Cardiology   Contact information:   [address] [place] [address] [phone]        Discharge Instructions:pacemaker instruction sheet Heart Healthy Diet  Signed: VQQVZD,GLOVFNGOLD,LAURA R Nurse Practitioner-Certified [facility]: [facility]ART[facility] 08/23/2013, 9:30 AM  Time spent on discharge :> 30 minutes.

## 2013-08-23 NOTE — Discharge Summary (Signed)
ELECTROPHYSIOLOGY PROCEDURE DISCHARGE SUMMARY    Patient ID: Tammy Wolf,  MRN: [id number], DOB/AGE: [birth year] 78 y.o.  Admit date: 08/21/2013 Discharge date: 08/23/2013  Primary Care Physician: Ailene Ravel, MD Primary Cardiologist: Donnie Aho Electrophysiologist: Ladona Ridgel  Primary Discharge Diagnosis:  Tachy brady syndrome status post pacemaker implantation this admission  Secondary Discharge Diagnosis:  1.  Hypertension 2.  Carotid artery occlusion 3.  Prior CVA 4.  Hyperlipidemia 5.  Hyperthyroidism  No Known Allergies   Procedures This Admission:  1.  Implantation of a dual chamber pacemaker on 08-22-13 by Dr Johney Frame.  The patient received a MDT ADDRL1 pacemaker with model number 5076 right atrial lead and 5092 right ventricular lead.  There were no early apparent complications.  2.  CXR on 08-23-13 demonstrated no PTX.  Brief HPI/Hospital Course:  Tammy Wolf is a 80 y.o. female with a past medical history as outlined above.  She began to have periods of dizziness and pre-syncope.  She was evaluated with an event monitor which demonstrated periods of tachycardia with atrial fibrillation as well as pauses of up to 6 seconds.  She was evaluated in the outpatient setting and pacemaker implantation was recommended.  Before pacemaker implantation was scheduled, she had progressive pre-syncopal spells and came to the hospital for further evaluation.  She was documented on telemetry to have up to 7 second post termination pauses. The patient underwent implantation of a Medtronic dual chamber pacemaker with details as outlined above.   She was monitored on telemetry overnight which demonstrated atrial fib and flutter as well as NSR.  Left chest was without hematoma or ecchymosis.  The device was interrogated and found to be functioning normally.  CXR was obtained and demonstrated no pneumothorax status post device implantation.  Wound care, arm mobility, and restrictions were  reviewed with the patient.  Dr Ladona Ridgel examined the patient and considered them stable for discharge to home.    Discharge Vitals: Blood pressure 126/61, pulse 65, temperature 97.5 F (36.4 C), temperature source Oral, resp. rate 18, height 5' (1.524 m), weight 136 lb (61.689 kg), SpO2 95.00%.    Labs:   Lab Results  Component Value Date   WBC 7.9 08/21/2013   HGB 15.2* 08/21/2013   HCT 45.0 08/21/2013   MCV 97.4 08/21/2013   PLT 292 08/21/2013    Recent Labs Lab 08/22/13 0430  NA 143  K 4.0  CL 103  CO2 25  BUN 14  CREATININE 0.98  CALCIUM 9.4  GLUCOSE 100*     Discharge Medications:    Medication List    ASK your doctor about these medications       cholecalciferol 1000 UNITS tablet  Commonly known as:  VITAMIN D  Take 1,000 Units by mouth daily.     flecainide 50 MG tablet  Commonly known as:  TAMBOCOR  Take 50 mg by mouth 2 (two) times daily.     levothyroxine 88 MCG tablet  Commonly known as:  SYNTHROID, LEVOTHROID  Take 88 mcg by mouth daily.     meclizine 25 MG tablet  Commonly known as:  ANTIVERT  Take 25 mg by mouth 3 (three) times daily as needed for dizziness.     metoprolol succinate 25 MG 24 hr tablet  Commonly known as:  TOPROL-XL  Take 12.5-25 mg by mouth daily. Take a whole tablet in the morning and half in the evening     multivitamin-iron-minerals-folic acid chewable tablet  Chew 1 tablet by mouth  daily.     PRADAXA 150 MG Caps capsule  Generic drug:  dabigatran  Take 150 mg by mouth 2 (two) times daily.     rosuvastatin 20 MG tablet  Commonly known as:  CRESTOR  Take 20 mg by mouth daily.     triamterene-hydrochlorothiazide 37.5-25 MG per tablet  Commonly known as:  MAXZIDE-25  Take 1 tablet by mouth daily.        Disposition:     Duration of Discharge Encounter: less than 30 minutes including physician time.  Signed,  Leonia ReevesGregg Taylor,M.D.

## 2013-08-28 ENCOUNTER — Emergency Department (HOSPITAL_COMMUNITY)
Admission: EM | Admit: 2013-08-28 | Discharge: 2013-08-28 | Disposition: A | Discharge status: Home / Self Care | Payer: Medicare Other | Attending: Emergency Medicine | Admitting: Emergency Medicine

## 2013-08-28 ENCOUNTER — Encounter (HOSPITAL_COMMUNITY): Payer: Self-pay | Admitting: Emergency Medicine

## 2013-08-28 ENCOUNTER — Emergency Department (HOSPITAL_COMMUNITY): Payer: Medicare Other

## 2013-08-28 DIAGNOSIS — R531 Weakness: Secondary | ICD-10-CM

## 2013-08-28 DIAGNOSIS — E079 Disorder of thyroid, unspecified: Secondary | ICD-10-CM | POA: Insufficient documentation

## 2013-08-28 DIAGNOSIS — Z950 Presence of cardiac pacemaker: Secondary | ICD-10-CM | POA: Insufficient documentation

## 2013-08-28 DIAGNOSIS — Z79899 Other long term (current) drug therapy: Secondary | ICD-10-CM | POA: Insufficient documentation

## 2013-08-28 DIAGNOSIS — IMO0002 Reserved for concepts with insufficient information to code with codable children: Secondary | ICD-10-CM | POA: Insufficient documentation

## 2013-08-28 DIAGNOSIS — R5383 Other fatigue: Principal | ICD-10-CM

## 2013-08-28 DIAGNOSIS — Z8673 Personal history of transient ischemic attack (TIA), and cerebral infarction without residual deficits: Secondary | ICD-10-CM | POA: Insufficient documentation

## 2013-08-28 DIAGNOSIS — E785 Hyperlipidemia, unspecified: Secondary | ICD-10-CM | POA: Insufficient documentation

## 2013-08-28 DIAGNOSIS — I10 Essential (primary) hypertension: Secondary | ICD-10-CM | POA: Insufficient documentation

## 2013-08-28 DIAGNOSIS — R5381 Other malaise: Secondary | ICD-10-CM | POA: Insufficient documentation

## 2013-08-28 DIAGNOSIS — I4891 Unspecified atrial fibrillation: Secondary | ICD-10-CM | POA: Insufficient documentation

## 2013-08-28 LAB — PROTIME-INR
INR: 1.46 (ref 0.00–1.49)
PROTHROMBIN TIME: 17.7 s — AB (ref 11.6–15.2)

## 2013-08-28 LAB — CBC
HEMATOCRIT: 39 % (ref 36.0–46.0)
HEMOGLOBIN: 13.1 g/dL (ref 12.0–15.0)
MCH: 32.2 pg (ref 26.0–34.0)
MCHC: 33.6 g/dL (ref 30.0–36.0)
MCV: 95.8 fL (ref 78.0–100.0)
Platelets: 197 10*3/uL (ref 150–400)
RBC: 4.07 MIL/uL (ref 3.87–5.11)
RDW: 13.1 % (ref 11.5–15.5)
WBC: 9.2 10*3/uL (ref 4.0–10.5)

## 2013-08-28 LAB — BASIC METABOLIC PANEL
Anion gap: 15 (ref 5–15)
BUN: 14 mg/dL (ref 6–23)
CO2: 26 mEq/L (ref 19–32)
Calcium: 9.1 mg/dL (ref 8.4–10.5)
Chloride: 96 mEq/L (ref 96–112)
Creatinine, Ser: 0.77 mg/dL (ref 0.50–1.10)
GFR, EST NON AFRICAN AMERICAN: 78 mL/min — AB (ref >90)
GLUCOSE: 97 mg/dL (ref 70–99)
POTASSIUM: 3.5 meq/L — AB (ref 3.7–5.3)
Sodium: 137 mEq/L (ref 137–147)

## 2013-08-28 LAB — I-STAT TROPONIN, ED: Troponin i, poc: 0 ng/mL (ref 0.00–0.08)

## 2013-08-28 NOTE — ED Provider Notes (Signed)
CSN: [id number]     Arrival date & time 08/28/13  1648 History   First MD Initiated Contact with Patient 08/28/13 1727     Chief Complaint  Patient presents with  . Dizziness     (Consider location/radiation/quality/duration/timing/severity/associated sxs/prior Treatment) Patient is a 80 y.o. female presenting with dizziness and weakness. The history is provided by the patient.  Dizziness Associated symptoms: no chest pain, no headaches and no shortness of breath   Weakness This is a new problem. The current episode started more than 2 days ago. The problem occurs constantly. The problem has been gradually worsening. Pertinent negatives include no chest pain, no abdominal pain, no headaches and no shortness of breath. Nothing aggravates the symptoms. Nothing relieves the symptoms. She has tried nothing for the symptoms.    Past Medical History  Diagnosis Date  . Hypertension   . Carotid artery occlusion   . Atrial fibrillation   . Stroke 04/20/11  . Hyperlipidemia   . Thyroid disease    Past Surgical History  Procedure Laterality Date  . Abdominal hysterectomy    . Cataract extraction, bilateral    . Appendectomy    . Carpal tunnel release      bilateral  . Tubal ligation    . Bladder tuck     Family History  Problem Relation Age of Onset  . Cancer Mother   . Heart disease Father   . Heart attack Father   . Other Brother     MS  . Multiple sclerosis Daughter    History  Substance Use Topics  . Smoking status: Never Smoker   . Smokeless tobacco: Never Used  . Alcohol Use: No   OB History   Grav Para Term Preterm Abortions TAB SAB Ect Mult Living                 Review of Systems  Respiratory: Negative for shortness of breath.   Cardiovascular: Negative for chest pain.  Gastrointestinal: Negative for abdominal pain.  Neurological: Positive for dizziness and weakness. Negative for headaches.  All other systems reviewed and are negative.     Allergies   Review of patient's allergies indicates no known allergies.  Home Medications   Prior to Admission medications   Medication Sig Start Date End Date Taking? Authorizing Provider  cholecalciferol (VITAMIN D) 1000 UNITS tablet Take 1,000 Units by mouth daily.   Yes Historical Provider, MD  flecainide (TAMBOCOR) 50 MG tablet Take 1.5 tablets (75 mg total) by mouth 2 (two) times daily. 08/23/13  Yes Nada Boozer, NP  levothyroxine (SYNTHROID, LEVOTHROID) 88 MCG tablet Take 88 mcg by mouth daily.   Yes Historical Provider, MD  metoprolol succinate (TOPROL-XL) 50 MG 24 hr tablet Take 1 tablet (50 mg total) by mouth 2 (two) times daily. Take with or immediately following a meal. 08/23/13  Yes Nada Boozer, NP  multivitamin-iron-minerals-folic acid (CENTRUM) chewable tablet Chew 1 tablet by mouth daily.   Yes Historical Provider, MD  PRADAXA 150 MG CAPS Take 150 mg by mouth 2 (two) times daily. 11/23/11  Yes Historical Provider, MD  rosuvastatin (CRESTOR) 20 MG tablet Take 20 mg by mouth daily.   Yes Historical Provider, MD  traZODone (DESYREL) 50 MG tablet Take 25-50 mg by mouth at bedtime.   Yes Historical Provider, MD  triamterene-hydrochlorothiazide (MAXZIDE-25) 37.5-25 MG per tablet Take 0.5 tablets by mouth daily.    Yes Historical Provider, MD  Vitamin Mixture (VITAMIN E COMPLETE) CAPS Take 1 capsule by mouth  daily.   Yes Historical Provider, MD  HYDROcodone-acetaminophen (NORCO/VICODIN) 5-325 MG per tablet Take 1-2 tablets by mouth every 4 (four) hours as needed for moderate pain. 08/23/13   Nada BoozerLaura Ingold, NP   BP 138/60  Pulse 61  Temp(Src) 98.7 F (37.1 C) (Oral)  Resp 17  SpO2 97% Physical Exam  Nursing note and vitals reviewed. Constitutional: She is oriented to person, place, and time. She appears well-developed and well-nourished. No distress.  HENT:  Head: Normocephalic and atraumatic.  Eyes: EOM are normal. Pupils are equal, round, and reactive to light.  Neck: Normal range of  motion. Neck supple.  Cardiovascular: Normal rate and regular rhythm.  Exam reveals no friction rub.   No murmur heard. Pulmonary/Chest: Effort normal and breath sounds normal. No respiratory distress. She has no wheezes. She has no rales.  Abdominal: Soft. She exhibits no distension. There is no tenderness. There is no rebound.  Musculoskeletal: Normal range of motion. She exhibits no edema.  Neurological: She is alert and oriented to person, place, and time.  Skin: She is not diaphoretic.    ED Course  Procedures (including critical care time) Labs Review Labs Reviewed  CBC  BASIC METABOLIC PANEL  PROTIME-INR  Rosezena SensorI-STAT TROPOININ, ED    Imaging Review Dg Chest 2 View  08/28/2013   CLINICAL DATA:  Dizziness. Weakness. Status post pacemaker placement 1 week ago.  EXAM: CHEST  2 VIEW  COMPARISON:  Chest x-ray 08/23/2013.  FINDINGS: Lung volumes are normal. No consolidative airspace disease. No pleural effusions. No pneumothorax. No pulmonary nodule or mass noted. Pulmonary vasculature and the cardiomediastinal silhouette are within normal limits. Left-sided pacemaker device in position with lead tips projecting over the expected location of the right atrium and right ventricular apex.  IMPRESSION: 1. No radiographic evidence of acute cardiopulmonary disease.   Electronically Signed   By: Trudie Reedaniel  Entrikin M.D.   On: 08/28/2013 18:22     EKG Interpretation   Date/Time:  Monday August 28 2013 17:02:25 EDT Ventricular Rate:  63 PR Interval:  212 QRS Duration: 86 QT Interval:  432 QTC Calculation: 442 R Axis:   -11 Text Interpretation:  Atrial-paced rhythm with prolonged AV conduction ST  \T\ T wave abnormality, consider inferior ischemia ST \\T \ T wave  abnormality, consider anterolateral ischemia Similar to prior Confirmed by  Gwendolyn GrantWALDEN  MD, Tymier Lindholm ([id number]) on 08/28/2013 5:53:24 PM      MDM   Final diagnoses:  Weakness  Malaise    80 year old female here with generalized weakness.  Just had a pacemaker placed last week. Patient had some confusion between her beta blocker prescriptions. At discharge she had her Toprol-XL stopped. The Toprol-XL was supposed to be 12.5 mg the morning and 25 mg at night. She was been written for Lopressor 75 mg twice a day. She could not get a Lopressor filled and was instructed to take 75 mg twice a day of her Toprol, but hasn't changed what she's been taking. She also has half pills of 50 mg Toprol XL in her pill bottle, not lone 25 mg pills.  Amidst all the confusion of her pills, she has normal labs, is tolerating PO, and had a normal interrogation of her pacer. I spoke with Dr. Shirlee LatchMcLean, who said she's not taking that much metoprolol, and will stable vitals, she should continue what she's doing. She's feeling better, has f/u in 1 week with Dr. Ladona Ridgelaylor. Stable for discharge.    Dagmar HaitWilliam Kavon Valenza, MD 08/29/13 [id number]

## 2013-08-28 NOTE — Discharge Instructions (Signed)
Please continue your beta-blockers as you are taking them currently. Please follow up with Cardiology as scheduled.  Fatigue Fatigue is a feeling of tiredness, lack of energy, lack of motivation, or feeling tired all the time. Having enough rest, good nutrition, and reducing stress will normally reduce fatigue. Consult your caregiver if it persists. The nature of your fatigue will help your caregiver to find out its cause. The treatment is based on the cause.  CAUSES  There are many causes for fatigue. Most of the time, fatigue can be traced to one or more of your habits or routines. Most causes fit into one or more of three general areas. They are: Lifestyle problems  Sleep disturbances.  Overwork.  Physical exertion.  Unhealthy habits.  Poor eating habits or eating disorders.  Alcohol and/or drug use .  Lack of proper nutrition (malnutrition). Psychological problems  Stress and/or anxiety problems.  Depression.  Grief.  Boredom. Medical Problems or Conditions  Anemia.  Pregnancy.  Thyroid gland problems.  Recovery from major surgery.  Continuous pain.  Emphysema or asthma that is not well controlled  Allergic conditions.  Diabetes.  Infections (such as mononucleosis).  Obesity.  Sleep disorders, such as sleep apnea.  Heart failure or other heart-related problems.  Cancer.  Kidney disease.  Liver disease.  Effects of certain medicines such as antihistamines, cough and cold remedies, prescription pain medicines, heart and blood pressure medicines, drugs used for treatment of cancer, and some antidepressants. SYMPTOMS  The symptoms of fatigue include:   Lack of energy.  Lack of drive (motivation).  Drowsiness.  Feeling of indifference to the surroundings. DIAGNOSIS  The details of how you feel help guide your caregiver in finding out what is causing the fatigue. You will be asked about your present and past health condition. It is important to  review all medicines that you take, including prescription and non-prescription items. A thorough exam will be done. You will be questioned about your feelings, habits, and normal lifestyle. Your caregiver may suggest blood tests, urine tests, or other tests to look for common medical causes of fatigue.  TREATMENT  Fatigue is treated by correcting the underlying cause. For example, if you have continuous pain or depression, treating these causes will improve how you feel. Similarly, adjusting the dose of certain medicines will help in reducing fatigue.  HOME CARE INSTRUCTIONS   Try to get the required amount of good sleep every night.  Eat a healthy and nutritious diet, and drink enough water throughout the day.  Practice ways of relaxing (including yoga or meditation).  Exercise regularly.  Make plans to change situations that cause stress. Act on those plans so that stresses decrease over time. Keep your work and personal routine reasonable.  Avoid street drugs and minimize use of alcohol.  Start taking a daily multivitamin after consulting your caregiver. SEEK MEDICAL CARE IF:   You have persistent tiredness, which cannot be accounted for.  You have fever.  You have unintentional weight loss.  You have headaches.  You have disturbed sleep throughout the night.  You are feeling sad.  You have constipation.  You have dry skin.  You have gained weight.  You are taking any new or different medicines that you suspect are causing fatigue.  You are unable to sleep at night.  You develop any unusual swelling of your legs or other parts of your body. SEEK IMMEDIATE MEDICAL CARE IF:   You are feeling confused.  Your vision is blurred.  You feel faint or pass out.  You develop severe headache.  You develop severe abdominal, pelvic, or back pain.  You develop chest pain, shortness of breath, or an irregular or fast heartbeat.  You are unable to pass a normal amount of  urine.  You develop abnormal bleeding such as bleeding from the rectum or you vomit blood.  You have thoughts about harming yourself or committing suicide.  You are worried that you might harm someone else. MAKE SURE YOU:   Understand these instructions.  Will watch your condition.  Will get help right away if you are not doing well or get worse. Document Released: 11/23/2006 Document Revised: 04/20/2011 Document Reviewed: 11/23/2006 [facility] Patient Information 2015 [employer], [place]. This information is not intended to replace advice given to you by your health care provider. Make sure you discuss any questions you have with your health care provider.

## 2013-08-28 NOTE — ED Notes (Signed)
Family at bedside. 

## 2013-08-28 NOTE — ED Notes (Signed)
Family at bedside.  I introduced myself to the family.

## 2013-08-28 NOTE — ED Notes (Signed)
PT placed in gown and in bed. Pt monitored by 12-lead, pulse ox and bp cuff.

## 2013-08-28 NOTE — ED Notes (Signed)
PT returned from bathroom. Pt monitored by pulse ox, bp cuff, and 5-lead.

## 2013-08-28 NOTE — ED Notes (Signed)
Pt c/o dizziness today; pt had pacemaker placed last week; pt sts feels generally weak as well; pt denies pain or SOB

## 2013-08-28 NOTE — ED Notes (Signed)
Charge nurse aware that the MD wants pacemaker interrogated.

## 2013-08-28 NOTE — ED Notes (Signed)
Patient is alert and orientedx4.  Patient was explained discharge instructions and they understood them with no questions.  Patria ManeSharon Williams, her daughter is taking the patient home.

## 2013-09-01 ENCOUNTER — Institutional Professional Consult (permissible substitution): Payer: Medicare Other | Admitting: Internal Medicine

## 2013-09-02 ENCOUNTER — Encounter (HOSPITAL_COMMUNITY): Payer: Self-pay | Admitting: *Deleted

## 2013-09-04 ENCOUNTER — Ambulatory Visit: Payer: Medicare Other

## 2013-09-06 ENCOUNTER — Ambulatory Visit (INDEPENDENT_AMBULATORY_CARE_PROVIDER_SITE_OTHER): Payer: Medicare Other | Admitting: *Deleted

## 2013-09-06 DIAGNOSIS — I495 Sick sinus syndrome: Secondary | ICD-10-CM

## 2013-09-06 DIAGNOSIS — I4891 Unspecified atrial fibrillation: Secondary | ICD-10-CM

## 2013-09-06 DIAGNOSIS — I480 Paroxysmal atrial fibrillation: Secondary | ICD-10-CM

## 2013-09-06 DIAGNOSIS — I455 Other specified heart block: Secondary | ICD-10-CM

## 2013-09-06 LAB — MDC_IDC_ENUM_SESS_TYPE_INCLINIC
Battery Remaining Longevity: 123 mo
Battery Voltage: 2.8 V
Brady Statistic AS VP Percent: 0 %
Lead Channel Pacing Threshold Amplitude: 0.75 V
Lead Channel Pacing Threshold Amplitude: 1 V
Lead Channel Pacing Threshold Pulse Width: 0.4 ms
Lead Channel Pacing Threshold Pulse Width: 0.4 ms
Lead Channel Setting Pacing Amplitude: 3.5 V
Lead Channel Setting Pacing Pulse Width: 0.4 ms
Lead Channel Setting Sensing Sensitivity: 5.6 mV
MDC IDC MSMT BATTERY IMPEDANCE: 100 Ohm
MDC IDC MSMT LEADCHNL RA IMPEDANCE VALUE: 461 Ohm
MDC IDC MSMT LEADCHNL RV IMPEDANCE VALUE: 645 Ohm
MDC IDC MSMT LEADCHNL RV SENSING INTR AMPL: 11.2 mV
MDC IDC SESS DTM: 20150729140837
MDC IDC SET LEADCHNL RV PACING AMPLITUDE: 3.5 V
MDC IDC STAT BRADY AP VP PERCENT: 1 %
MDC IDC STAT BRADY AP VS PERCENT: 89 %
MDC IDC STAT BRADY AS VS PERCENT: 9 %

## 2013-09-06 NOTE — Progress Notes (Signed)
Wound check appointment. Steri-strips removed. Wound without redness or edema. Incision edges approximated, wound well healed. Normal device function. Thresholds, sensing, and impedances consistent with implant measurements. Device programmed at 3.5V for extra safety margin until 3 month visit. Histogram distribution appropriate for patient and level of activity. 18 mode switches(<0.1%)---1 AHR x 2 hrs 7 mins @ 272/122 + Xarelto. No high ventricular rates noted. Patient educated about wound care, arm mobility, lifting restrictions. ROV in 3 months with JA.

## 2013-10-03 ENCOUNTER — Encounter: Payer: Self-pay | Admitting: Internal Medicine

## 2013-11-03 ENCOUNTER — Ambulatory Visit: Payer: Medicare Other | Admitting: Adult Health

## 2013-11-27 ENCOUNTER — Encounter: Payer: Medicare Other | Admitting: Internal Medicine

## 2013-12-05 ENCOUNTER — Encounter: Payer: Self-pay | Admitting: Vascular Surgery

## 2013-12-06 ENCOUNTER — Encounter: Payer: Self-pay | Admitting: Vascular Surgery

## 2013-12-06 ENCOUNTER — Ambulatory Visit (HOSPITAL_COMMUNITY)
Admission: RE | Admit: 2013-12-06 | Discharge: 2013-12-06 | Disposition: A | Discharge status: Home / Self Care | Payer: Medicare Other | Source: Ambulatory Visit | Attending: Vascular Surgery | Admitting: Vascular Surgery

## 2013-12-06 ENCOUNTER — Ambulatory Visit (INDEPENDENT_AMBULATORY_CARE_PROVIDER_SITE_OTHER): Payer: Medicare Other | Admitting: Vascular Surgery

## 2013-12-06 VITALS — BP 130/59 | HR 75 | Resp 18 | Ht 60.0 in | Wt 142.0 lb

## 2013-12-06 DIAGNOSIS — I6521 Occlusion and stenosis of right carotid artery: Secondary | ICD-10-CM

## 2013-12-06 DIAGNOSIS — I6523 Occlusion and stenosis of bilateral carotid arteries: Secondary | ICD-10-CM | POA: Diagnosis present

## 2013-12-06 DIAGNOSIS — G459 Transient cerebral ischemic attack, unspecified: Secondary | ICD-10-CM | POA: Insufficient documentation

## 2013-12-06 NOTE — Assessment & Plan Note (Signed)
The patient has a less than 40% carotid stenosis bilaterally. This has been stable for some time and she is asymptomatic. This reason I think it is safe to stretcher follow up out to 2 years. I ordered a follow up carotid duplex can in 2 years and I'll see her back at that time. She knows to call sooner if she has problems. She is unable to take aspirin as she is on Pradaxa.

## 2013-12-06 NOTE — Progress Notes (Signed)
Patient ID: Tammy Wolf, female   DOB: 1932/08/24, 80 y.o.   MRN: [id number]  Reason for Consult: Follow up of carotid disease  Subjective:     HPI:  Tammy Wolf is a 80 y.o. female Who I last saw in October 2013. She has a history of mild carotid disease and I have been seeing her in 18 month intervals. At the time of her last visit, she hadn't minimal disease bilaterally. She was set up for an 18 month follow up visit as she is asymptomatic. She does have some tortuosity of both distal internal carotid arteries which may explain some of the elevated velocities previously seen.  Past Medical History  Diagnosis Date  . Hypertension   . Carotid artery occlusion   . Atrial fibrillation   . Stroke 04/20/11  . Hyperlipidemia   . Thyroid disease    Family History  Problem Relation Age of Onset  . Cancer Mother   . Heart disease Father   . Heart attack Father   . Other Brother     MS  . Multiple sclerosis Daughter    Past Surgical History  Procedure Laterality Date  . Abdominal hysterectomy    . Cataract extraction, bilateral    . Appendectomy    . Carpal tunnel release      bilateral  . Tubal ligation    . Bladder tuck    . Pacemaker insertion  08/22/13    MDT ADDRL1 pacemaker implanted by Dr Johney FrameAllred for tachy-brady syndrome    Short Social History:  History  Substance Use Topics  . Smoking status: Never Smoker   . Smokeless tobacco: Never Used  . Alcohol Use: No    No Known Allergies  Current Outpatient Prescriptions  Medication Sig Dispense Refill  . cholecalciferol (VITAMIN D) 1000 UNITS tablet Take 1,000 Units by mouth daily.      . flecainide (TAMBOCOR) 50 MG tablet Take 1.5 tablets (75 mg total) by mouth 2 (two) times daily.  90 tablet  6  . levothyroxine (SYNTHROID, LEVOTHROID) 88 MCG tablet Take 88 mcg by mouth daily.      . metoprolol succinate (TOPROL-XL) 50 MG 24 hr tablet Take 50 mg by mouth 2 (two) times daily. Take with or immediately  following a meal. 1 tab in the am and1/2 in the pm      . multivitamin-iron-minerals-folic acid (CENTRUM) chewable tablet Chew 1 tablet by mouth daily.      Marland Kitchen. PRADAXA 150 MG CAPS Take 150 mg by mouth 2 (two) times daily.      . rosuvastatin (CRESTOR) 20 MG tablet Take 20 mg by mouth daily.      . traZODone (DESYREL) 50 MG tablet Take 25-50 mg by mouth at bedtime.      . triamterene-hydrochlorothiazide (MAXZIDE-25) 37.5-25 MG per tablet Take 0.5 tablets by mouth daily.       . Vitamin Mixture (VITAMIN E COMPLETE) CAPS Take 1 capsule by mouth daily.      Marland Kitchen. HYDROcodone-acetaminophen (NORCO/VICODIN) 5-325 MG per tablet Take 1-2 tablets by mouth every 4 (four) hours as needed for moderate pain.  10 tablet  0   No current facility-administered medications for this visit.    Review of Systems  Constitutional: Negative for chills and fever.  Eyes: Negative for loss of vision.  Respiratory: Negative for cough and wheezing.  Cardiovascular: Negative for chest pain, chest tightness, claudication, dyspnea with exertion, orthopnea and palpitations.  GI: Negative for blood in stool and  vomiting.  GU: Negative for dysuria and hematuria.  Musculoskeletal: Negative for leg pain, joint pain and myalgias.  Skin: Negative for rash and wound.  Neurological: Negative for dizziness and speech difficulty.  Hematologic: Negative for bruises/bleeds easily. Psychiatric: Negative for depressed mood.        Objective:  Objective  Filed Vitals:   12/06/13 1542 12/06/13 1543  BP: 123/53 130/59  Pulse: 84 75  Resp: 18   Height: 5' (1.524 m)   Weight: 142 lb (64.411 kg)    Body mass index is 27.73 kg/(m^2).  Physical Exam  Constitutional: She is oriented to person, place, and time. She appears well-developed and well-nourished.  HENT:  Head: Normocephalic and atraumatic.  Neck: Neck supple. No JVD present. No thyromegaly present.  Cardiovascular: Normal rate, regular rhythm and normal heart sounds.  Exam  reveals no friction rub.   No murmur heard. Pulmonary/Chest: Breath sounds normal. She has no wheezes. She has no rales.  Abdominal: Soft. Bowel sounds are normal. There is no tenderness.  I do not palpate an aneurysm.  Musculoskeletal: Normal range of motion. She exhibits no edema.  Lymphadenopathy:    She has no cervical adenopathy.  Neurological: She is alert and oriented to person, place, and time. She has normal strength. No sensory deficit.  Skin: No lesion and no rash noted.  Psychiatric: She has a normal mood and affect.   Data: CAROTID DUPLEX: I have independently interpreted her carotid duplex scan which shows a less than 40% right internal carotid artery stenosis. There is no significant left internal carotid artery stenosis and then a 5. There is some tortuosity in the distal left internal carotid artery. This study is unchanged from the study in April 2014.      Assessment/Plan:     Carotid stenosis The patient has a less than 40% carotid stenosis bilaterally. This has been stable for some time and she is asymptomatic. This reason I think it is safe to stretcher follow up out to 2 years. I ordered a follow up carotid duplex can in 2 years and I'll see her back at that time. She knows to call sooner if she has problems. She is unable to take aspirin as she is on Pradaxa.    Chuck Hinthristopher S Sosie Gato MD [facility]

## 2013-12-07 NOTE — Addendum Note (Signed)
Addended by: Sharee PimpleMCCHESNEY, Dhruvan Gullion K on: 12/07/2013 10:49 AM   Modules accepted: Orders

## 2013-12-27 ENCOUNTER — Encounter: Payer: Self-pay | Admitting: Neurology

## 2014-01-02 ENCOUNTER — Encounter: Payer: Self-pay | Admitting: Neurology

## 2014-01-09 ENCOUNTER — Encounter: Payer: Self-pay | Admitting: *Deleted

## 2014-01-18 ENCOUNTER — Encounter (HOSPITAL_COMMUNITY): Payer: Self-pay | Admitting: Internal Medicine

## 2014-02-12 DIAGNOSIS — Z1231 Encounter for screening mammogram for malignant neoplasm of breast: Secondary | ICD-10-CM | POA: Diagnosis not present

## 2014-03-21 ENCOUNTER — Other Ambulatory Visit: Payer: Self-pay | Admitting: Cardiology

## 2014-04-30 ENCOUNTER — Other Ambulatory Visit: Payer: Self-pay | Admitting: Cardiology

## 2014-04-30 NOTE — Telephone Encounter (Signed)
Rx has been sent to the pharmacy electronically. ° °

## 2014-05-25 DIAGNOSIS — E039 Hypothyroidism, unspecified: Secondary | ICD-10-CM | POA: Diagnosis not present

## 2014-05-25 DIAGNOSIS — Z1389 Encounter for screening for other disorder: Secondary | ICD-10-CM | POA: Diagnosis not present

## 2014-05-25 DIAGNOSIS — E782 Mixed hyperlipidemia: Secondary | ICD-10-CM | POA: Diagnosis not present

## 2014-05-25 DIAGNOSIS — I10 Essential (primary) hypertension: Secondary | ICD-10-CM | POA: Diagnosis not present

## 2014-05-25 DIAGNOSIS — Z9181 History of falling: Secondary | ICD-10-CM | POA: Diagnosis not present

## 2014-05-25 DIAGNOSIS — E876 Hypokalemia: Secondary | ICD-10-CM | POA: Diagnosis not present

## 2014-06-06 ENCOUNTER — Other Ambulatory Visit: Payer: Self-pay | Admitting: Cardiology

## 2014-06-07 NOTE — Telephone Encounter (Signed)
Not sure.  I would call the patient to clarify

## 2014-06-08 ENCOUNTER — Other Ambulatory Visit: Payer: Self-pay | Admitting: *Deleted

## 2014-06-08 MED ORDER — FLECAINIDE ACETATE 50 MG PO TABS: 75.0000 mg | ORAL_TABLET | Freq: Two times a day (BID) | ORAL | Status: AC

## 2014-06-08 NOTE — Telephone Encounter (Signed)
°  1. Which medications need to be refilled? Flecainide 50 mg   2. Which pharmacy is medication to be sent to?[facility] [phone]  3. Do they need a 30 day or 90 day supply?30  4. Would they like a call back once the medication has been sent to the pharmacy? No

## 2014-06-25 DIAGNOSIS — I10 Essential (primary) hypertension: Secondary | ICD-10-CM | POA: Diagnosis not present

## 2014-06-25 DIAGNOSIS — Z950 Presence of cardiac pacemaker: Secondary | ICD-10-CM | POA: Diagnosis not present

## 2014-06-25 DIAGNOSIS — I480 Paroxysmal atrial fibrillation: Secondary | ICD-10-CM | POA: Diagnosis not present

## 2014-07-10 ENCOUNTER — Other Ambulatory Visit: Payer: Self-pay | Admitting: Cardiology

## 2014-07-12 NOTE — Telephone Encounter (Signed)
I would call the patient and ask how she is taking her medication

## 2014-07-15 DIAGNOSIS — Z950 Presence of cardiac pacemaker: Secondary | ICD-10-CM | POA: Diagnosis not present

## 2014-08-23 DIAGNOSIS — J309 Allergic rhinitis, unspecified: Secondary | ICD-10-CM | POA: Diagnosis not present

## 2014-10-24 DIAGNOSIS — R5381 Other malaise: Secondary | ICD-10-CM | POA: Diagnosis not present

## 2014-10-24 DIAGNOSIS — E559 Vitamin D deficiency, unspecified: Secondary | ICD-10-CM | POA: Diagnosis not present

## 2014-10-24 DIAGNOSIS — E780 Pure hypercholesterolemia: Secondary | ICD-10-CM | POA: Diagnosis not present

## 2014-10-24 DIAGNOSIS — I4891 Unspecified atrial fibrillation: Secondary | ICD-10-CM | POA: Diagnosis not present

## 2014-10-24 DIAGNOSIS — E039 Hypothyroidism, unspecified: Secondary | ICD-10-CM | POA: Diagnosis not present

## 2014-10-24 DIAGNOSIS — R5383 Other fatigue: Secondary | ICD-10-CM | POA: Diagnosis not present

## 2014-10-29 DIAGNOSIS — Z45010 Encounter for checking and testing of cardiac pacemaker pulse generator [battery]: Secondary | ICD-10-CM | POA: Diagnosis not present

## 2014-10-29 DIAGNOSIS — Z950 Presence of cardiac pacemaker: Secondary | ICD-10-CM | POA: Diagnosis not present

## 2014-11-27 DIAGNOSIS — J310 Chronic rhinitis: Secondary | ICD-10-CM | POA: Diagnosis not present

## 2014-11-27 DIAGNOSIS — Z23 Encounter for immunization: Secondary | ICD-10-CM | POA: Diagnosis not present

## 2015-01-07 DIAGNOSIS — I480 Paroxysmal atrial fibrillation: Secondary | ICD-10-CM | POA: Diagnosis not present

## 2015-01-07 DIAGNOSIS — Z950 Presence of cardiac pacemaker: Secondary | ICD-10-CM | POA: Diagnosis not present

## 2015-01-07 DIAGNOSIS — I495 Sick sinus syndrome: Secondary | ICD-10-CM | POA: Diagnosis not present

## 2015-02-12 DIAGNOSIS — E039 Hypothyroidism, unspecified: Secondary | ICD-10-CM | POA: Diagnosis not present

## 2015-02-12 DIAGNOSIS — R5383 Other fatigue: Secondary | ICD-10-CM | POA: Diagnosis not present

## 2015-02-12 DIAGNOSIS — R5381 Other malaise: Secondary | ICD-10-CM | POA: Diagnosis not present

## 2015-02-12 DIAGNOSIS — J310 Chronic rhinitis: Secondary | ICD-10-CM | POA: Diagnosis not present

## 2015-02-12 DIAGNOSIS — I4891 Unspecified atrial fibrillation: Secondary | ICD-10-CM | POA: Diagnosis not present

## 2015-02-18 DIAGNOSIS — Z950 Presence of cardiac pacemaker: Secondary | ICD-10-CM | POA: Diagnosis not present

## 2015-02-18 DIAGNOSIS — Z7901 Long term (current) use of anticoagulants: Secondary | ICD-10-CM | POA: Diagnosis not present

## 2015-03-04 DIAGNOSIS — R413 Other amnesia: Secondary | ICD-10-CM | POA: Diagnosis not present

## 2015-03-04 DIAGNOSIS — R319 Hematuria, unspecified: Secondary | ICD-10-CM | POA: Diagnosis not present

## 2015-03-04 DIAGNOSIS — I10 Essential (primary) hypertension: Secondary | ICD-10-CM | POA: Diagnosis not present

## 2015-03-04 DIAGNOSIS — E039 Hypothyroidism, unspecified: Secondary | ICD-10-CM | POA: Diagnosis not present

## 2015-03-05 DIAGNOSIS — R413 Other amnesia: Secondary | ICD-10-CM | POA: Diagnosis not present

## 2015-03-07 DIAGNOSIS — Z0000 Encounter for general adult medical examination without abnormal findings: Secondary | ICD-10-CM | POA: Diagnosis not present

## 2015-03-07 DIAGNOSIS — R413 Other amnesia: Secondary | ICD-10-CM | POA: Diagnosis not present

## 2015-03-18 DIAGNOSIS — F0150 Vascular dementia without behavioral disturbance: Secondary | ICD-10-CM | POA: Diagnosis not present

## 2015-03-18 DIAGNOSIS — I480 Paroxysmal atrial fibrillation: Secondary | ICD-10-CM | POA: Diagnosis not present

## 2015-04-08 DIAGNOSIS — Z1231 Encounter for screening mammogram for malignant neoplasm of breast: Secondary | ICD-10-CM | POA: Diagnosis not present

## 2015-04-15 DIAGNOSIS — E039 Hypothyroidism, unspecified: Secondary | ICD-10-CM | POA: Diagnosis not present

## 2015-04-15 DIAGNOSIS — F0150 Vascular dementia without behavioral disturbance: Secondary | ICD-10-CM | POA: Diagnosis not present

## 2015-04-15 DIAGNOSIS — Z79899 Other long term (current) drug therapy: Secondary | ICD-10-CM | POA: Diagnosis not present

## 2015-04-15 DIAGNOSIS — I10 Essential (primary) hypertension: Secondary | ICD-10-CM | POA: Diagnosis not present

## 2015-05-27 DIAGNOSIS — E039 Hypothyroidism, unspecified: Secondary | ICD-10-CM | POA: Diagnosis not present

## 2015-05-27 DIAGNOSIS — I480 Paroxysmal atrial fibrillation: Secondary | ICD-10-CM | POA: Diagnosis not present

## 2015-05-27 DIAGNOSIS — I10 Essential (primary) hypertension: Secondary | ICD-10-CM | POA: Diagnosis not present

## 2015-05-27 DIAGNOSIS — F0150 Vascular dementia without behavioral disturbance: Secondary | ICD-10-CM | POA: Diagnosis not present

## 2015-06-03 DIAGNOSIS — E039 Hypothyroidism, unspecified: Secondary | ICD-10-CM | POA: Diagnosis not present

## 2015-06-03 DIAGNOSIS — I10 Essential (primary) hypertension: Secondary | ICD-10-CM | POA: Diagnosis not present

## 2015-06-19 DIAGNOSIS — E876 Hypokalemia: Secondary | ICD-10-CM | POA: Diagnosis not present

## 2015-06-19 DIAGNOSIS — E039 Hypothyroidism, unspecified: Secondary | ICD-10-CM | POA: Diagnosis not present

## 2015-06-28 DIAGNOSIS — Z950 Presence of cardiac pacemaker: Secondary | ICD-10-CM | POA: Diagnosis not present

## 2015-06-28 DIAGNOSIS — Z7901 Long term (current) use of anticoagulants: Secondary | ICD-10-CM | POA: Diagnosis not present

## 2015-07-30 ENCOUNTER — Other Ambulatory Visit: Payer: Self-pay | Admitting: Internal Medicine

## 2015-09-08 DIAGNOSIS — R509 Fever, unspecified: Secondary | ICD-10-CM | POA: Diagnosis not present

## 2015-09-08 DIAGNOSIS — J069 Acute upper respiratory infection, unspecified: Secondary | ICD-10-CM | POA: Diagnosis not present

## 2015-09-12 DIAGNOSIS — J0140 Acute pansinusitis, unspecified: Secondary | ICD-10-CM | POA: Diagnosis not present

## 2015-09-12 DIAGNOSIS — I10 Essential (primary) hypertension: Secondary | ICD-10-CM | POA: Diagnosis not present

## 2015-09-12 DIAGNOSIS — I480 Paroxysmal atrial fibrillation: Secondary | ICD-10-CM | POA: Diagnosis not present

## 2015-10-01 DIAGNOSIS — L508 Other urticaria: Secondary | ICD-10-CM | POA: Diagnosis not present

## 2015-10-17 DIAGNOSIS — Z950 Presence of cardiac pacemaker: Secondary | ICD-10-CM | POA: Diagnosis not present

## 2015-10-17 DIAGNOSIS — I10 Essential (primary) hypertension: Secondary | ICD-10-CM | POA: Diagnosis not present

## 2015-10-17 DIAGNOSIS — I480 Paroxysmal atrial fibrillation: Secondary | ICD-10-CM | POA: Diagnosis not present

## 2015-10-17 DIAGNOSIS — Z7901 Long term (current) use of anticoagulants: Secondary | ICD-10-CM | POA: Diagnosis not present

## 2015-10-24 DIAGNOSIS — E039 Hypothyroidism, unspecified: Secondary | ICD-10-CM | POA: Diagnosis not present

## 2015-10-24 DIAGNOSIS — I10 Essential (primary) hypertension: Secondary | ICD-10-CM | POA: Diagnosis not present

## 2015-10-24 DIAGNOSIS — R5383 Other fatigue: Secondary | ICD-10-CM | POA: Diagnosis not present

## 2015-10-24 DIAGNOSIS — R5381 Other malaise: Secondary | ICD-10-CM | POA: Diagnosis not present

## 2015-11-08 DIAGNOSIS — Z23 Encounter for immunization: Secondary | ICD-10-CM | POA: Diagnosis not present

## 2015-12-04 ENCOUNTER — Encounter (HOSPITAL_COMMUNITY): Payer: Self-pay

## 2015-12-04 ENCOUNTER — Ambulatory Visit: Payer: Self-pay | Admitting: Family

## 2016-02-27 IMAGING — MR MR HEAD W/O CM
10 series · 40 of 48 positions shown · non-contrast
Comparison: Brain MRI 03/25/2012 and earlier.

CLINICAL DATA: [age] -year-old female with dizziness.
Initial encounter.

EXAM:
MRI HEAD WITHOUT CONTRAST
TECHNIQUE: Multiplanar, multiecho pulse sequences of the brain and surrounding
structures were obtained without intravenous contrast.

[Series 3: T1 · sagittal · 5.0mm · 0.45mm/px · 3 of 19 slices shown]
[im 1/19]
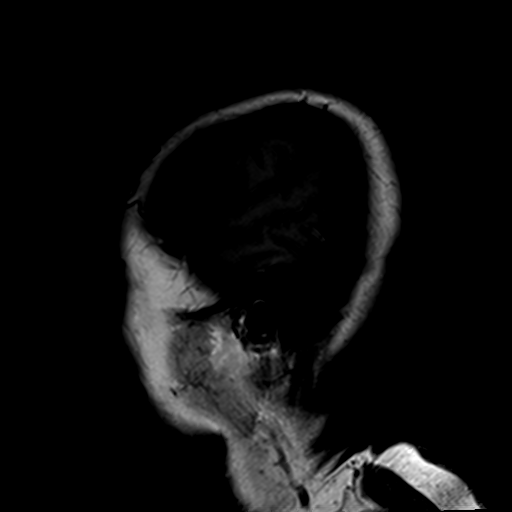
[im 10/19]
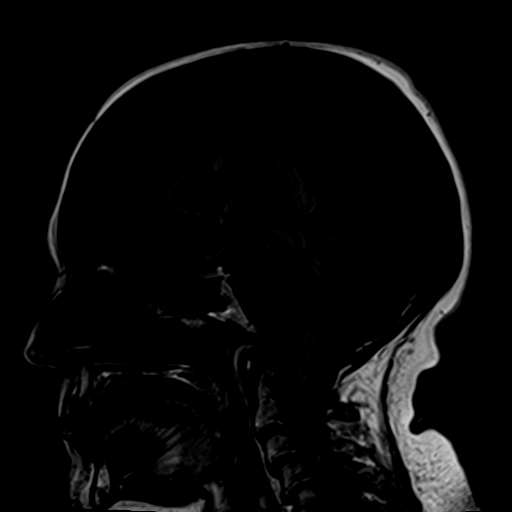
[im 19/19]
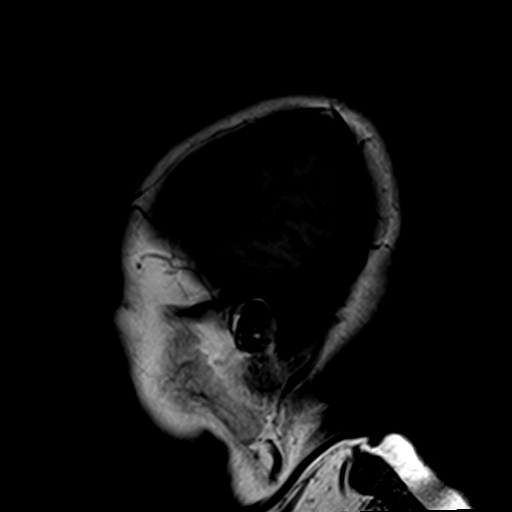

[Series 4: DWI · axial · 5.0mm · 0.90mm/px · z∈[-71,+65]mm · 6 of 44 slices shown (1 of 4)]
[im 1/44]
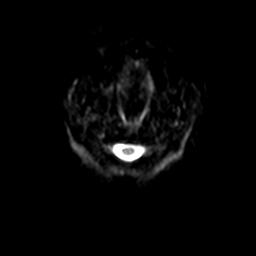
[im 9/44]
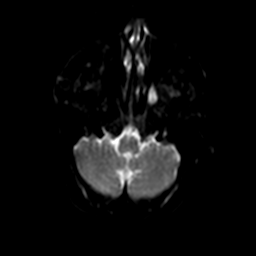
[im 18/44]
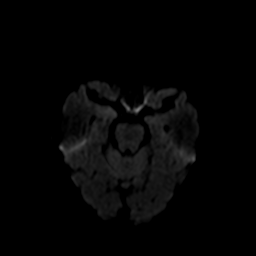
[im 26/44]
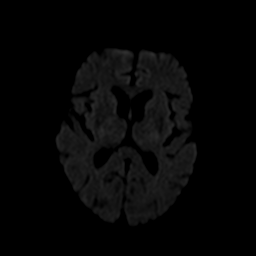
[im 35/44]
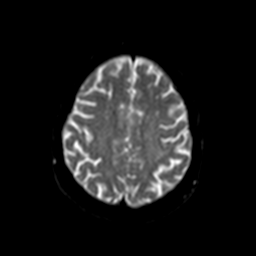
[im 44/44]
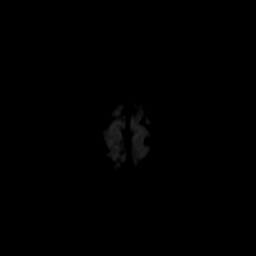

[Series 5: DWI · axial · 5.0mm · 0.90mm/px · z∈[-71,+65]mm · 3 of 22 slices shown (2 of 4)]
[im 1/22]
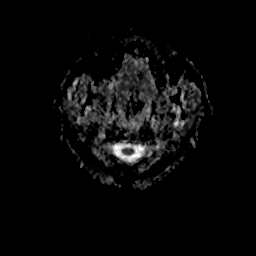
[im 11/22]
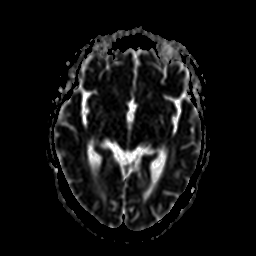
[im 22/22]
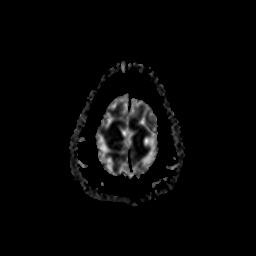

[Series 6: DWI · coronal · 5.0mm · 0.90mm/px · 6 of 48 slices shown (3 of 4)]
[im 1/48]
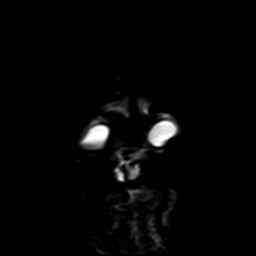
[im 10/48]
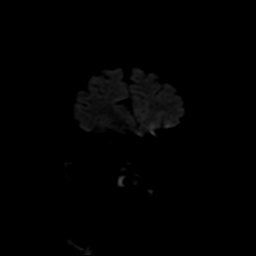
[im 19/48]
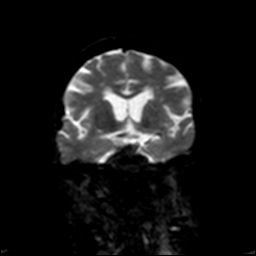
[im 29/48]
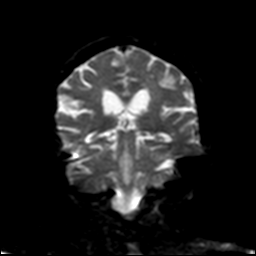
[im 38/48]
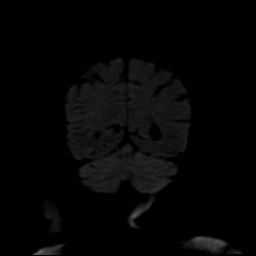
[im 48/48]
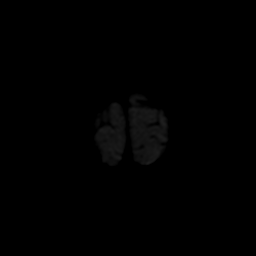

[Series 7: DWI · coronal · 5.0mm · 0.90mm/px · 3 of 24 slices shown (4 of 4)]
[im 1/24]
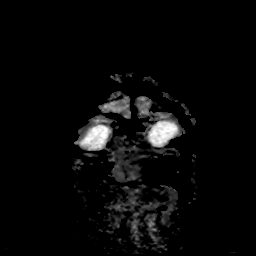
[im 12/24]
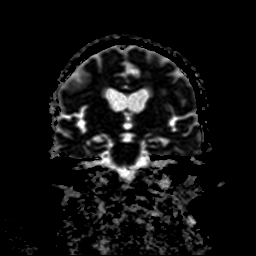
[im 24/24]
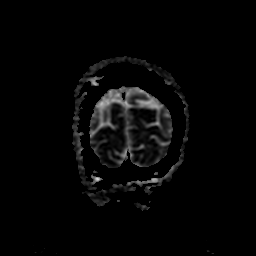

[Series 8: FLAIR · axial · 5.0mm · 0.45mm/px · z∈[-70,+66]mm · 3 of 22 slices shown]
[im 1/22]
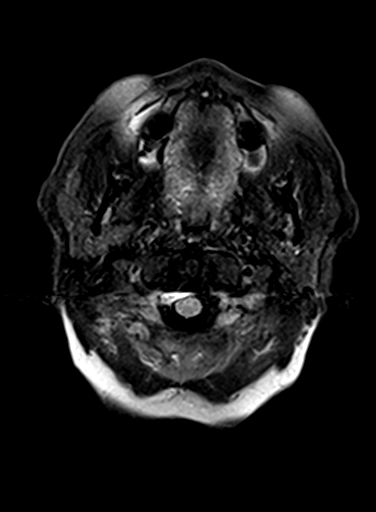
[im 11/22]
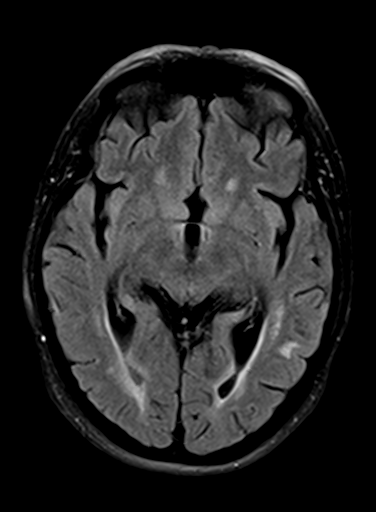
[im 22/22]
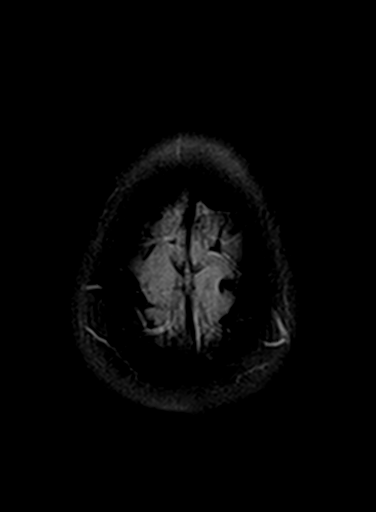

[Series 9: T2 · axial · 5.0mm · 0.45mm/px · z∈[-70,+66]mm · 3 of 22 slices shown (1 of 2)]
[im 1/22]
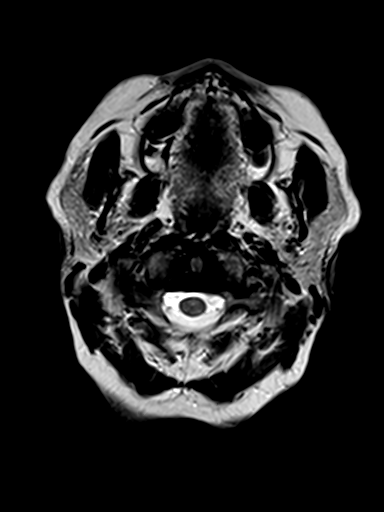
[im 11/22]
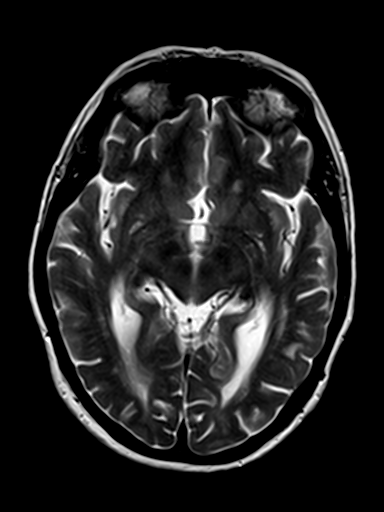
[im 22/22]
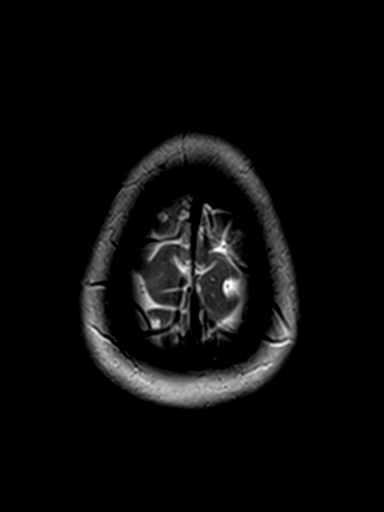

[Series 11: swi_images · axial · 2.2mm · 0.90mm/px · z∈[-69,+64]mm · 8 of 60 slices shown]
[im 1/60]
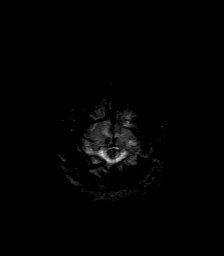
[im 9/60]
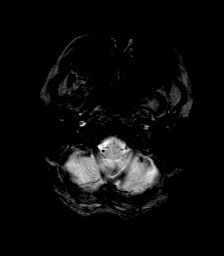
[im 17/60]
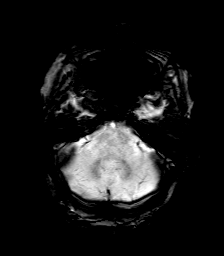
[im 26/60]
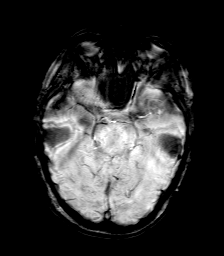
[im 34/60]
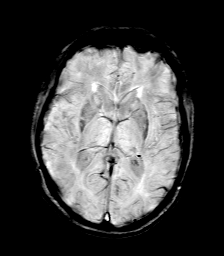
[im 43/60]
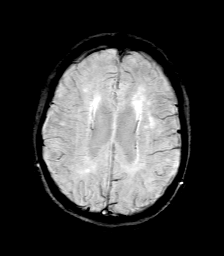
[im 51/60]
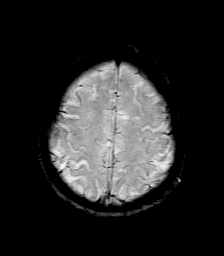
[im 60/60]
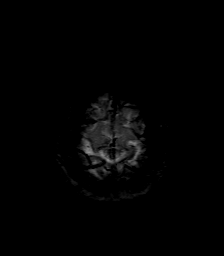

[Series 12: axial (person_name)1 volume · axial · 2.0mm · 0.45mm/px · z∈[-73,-59]mm · 2 of 72 slices shown]
[im 1/72]
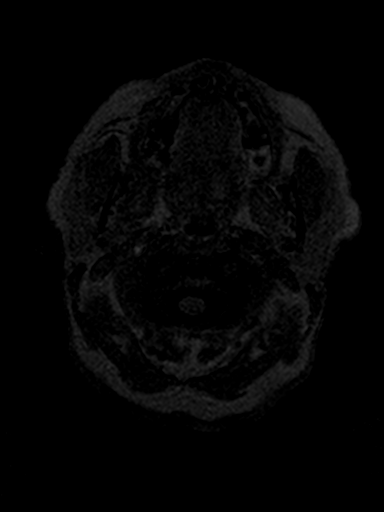
[im 8/72]
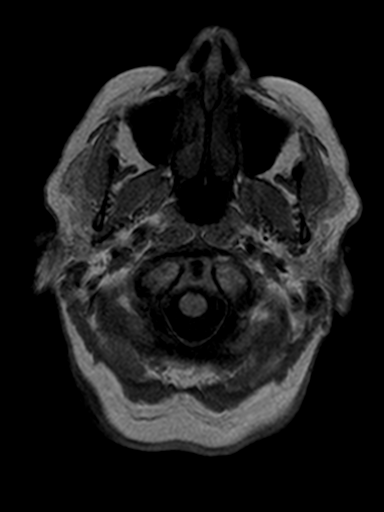

[Series 13: T2 · coronal · 5.0mm · 0.41mm/px · 3 of 24 slices shown (2 of 2)]
[im 1/24]
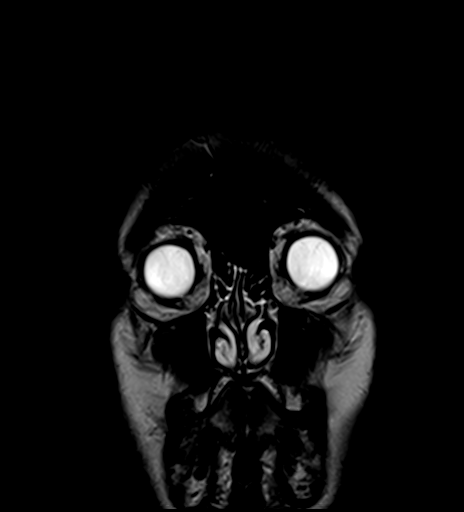
[im 12/24]
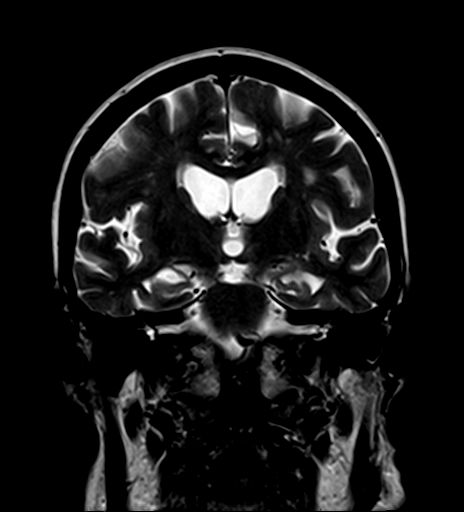
[im 24/24]
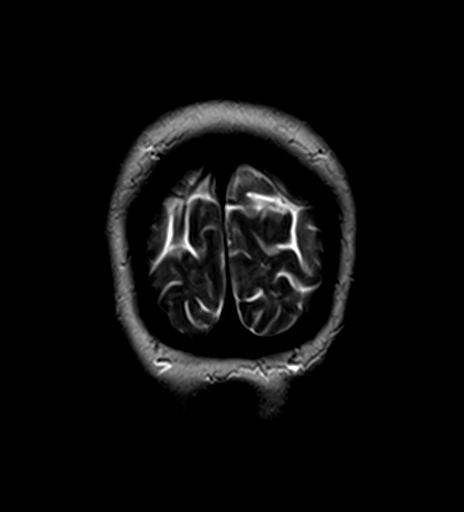

[40 of 48 positions shown; findings below may reference images not displayed]

FINDINGS: Stable cerebral volume since 1773. Major intracranial vascular flow
voids are stable. No restricted diffusion to suggest acute
infarction. No midline shift, mass effect, evidence of mass lesion,
ventriculomegaly, extra-axial collection or acute intracranial
hemorrhage. Cervicomedullary junction and pituitary are within
normal limits. Stable visualized cervical spine. Chronic cerebral
white matter T2 and FLAIR hyperintensity, patchy and confluent in
areas, in a nonspecific configuration. Stable to mild progression
since 1773. No cortical encephalomalacia. Deep gray matter nuclei,
brainstem and cerebellum remain within normal limits. Visualized
internal auditory structures appear stable and normal. Mastoids are
clear.

Chronic left sphenoid sinus inflammatory changes and subtotal
opacification. Stable paranasal sinuses since 1773. Stable orbits
soft tissues. Visualized scalp soft tissues are within normal
limits.
IMPRESSION: No acute intracranial abnormality. Stable to mildly increased
nonspecific white matter signal changes since 1773, most commonly
due to chronic small vessel disease.

## 2017-11-30 ENCOUNTER — Telehealth: Payer: Self-pay | Admitting: Cardiology

## 2017-11-30 ENCOUNTER — Encounter: Payer: Self-pay | Admitting: *Deleted

## 2017-11-30 NOTE — Telephone Encounter (Signed)
Confirmed remote transmission w/ pt daughter.   

## 2017-12-01 ENCOUNTER — Encounter: Payer: Self-pay | Admitting: Cardiology

## 2018-01-10 ENCOUNTER — Ambulatory Visit (INDEPENDENT_AMBULATORY_CARE_PROVIDER_SITE_OTHER): Payer: Medicare Other

## 2018-01-10 DIAGNOSIS — I495 Sick sinus syndrome: Secondary | ICD-10-CM

## 2018-01-10 NOTE — Progress Notes (Signed)
Remote pacemaker transmission.   

## 2018-02-09 LAB — CUP PACEART REMOTE DEVICE CHECK
Battery Impedance: 231 Ohm
Battery Voltage: 2.79 V
Brady Statistic AP VP Percent: 0 %
Brady Statistic AP VS Percent: 100 %
Brady Statistic AS VP Percent: 0 %
Brady Statistic AS VS Percent: 0 %
Implantable Lead Location: 753859
Implantable Lead Location: 753860
Implantable Lead Model: 5076
Implantable Lead Model: 5092
Lead Channel Pacing Threshold Amplitude: 0.625 V
Lead Channel Pacing Threshold Amplitude: 0.625 V
Lead Channel Pacing Threshold Pulse Width: 0.4 ms
Lead Channel Pacing Threshold Pulse Width: 0.4 ms
Lead Channel Setting Pacing Amplitude: 2 V
Lead Channel Setting Pacing Pulse Width: 0.4 ms
Lead Channel Setting Sensing Sensitivity: 4 mV
MDC IDC LEAD IMPLANT DT: 20150714
MDC IDC LEAD IMPLANT DT: 20150714
MDC IDC MSMT BATTERY REMAINING LONGEVITY: 129 mo
MDC IDC MSMT LEADCHNL RA IMPEDANCE VALUE: 422 Ohm
MDC IDC MSMT LEADCHNL RV IMPEDANCE VALUE: 620 Ohm
MDC IDC PG IMPLANT DT: 20150714
MDC IDC SESS DTM: 20191127230536
MDC IDC SET LEADCHNL RA PACING AMPLITUDE: 1.5 V

## 2018-04-11 ENCOUNTER — Encounter: Payer: Medicare Other | Admitting: *Deleted

## 2018-04-12 ENCOUNTER — Telehealth: Payer: Self-pay

## 2018-04-12 NOTE — Telephone Encounter (Signed)
Left message for patient to remind of missed remote transmission.  

## 2018-04-19 ENCOUNTER — Encounter: Payer: Self-pay | Admitting: Cardiology

## 2018-04-22 ENCOUNTER — Ambulatory Visit (INDEPENDENT_AMBULATORY_CARE_PROVIDER_SITE_OTHER): Payer: Medicare Other | Admitting: *Deleted

## 2018-04-22 DIAGNOSIS — I495 Sick sinus syndrome: Secondary | ICD-10-CM | POA: Diagnosis not present

## 2018-04-23 LAB — CUP PACEART REMOTE DEVICE CHECK
Battery Impedance: 231 Ohm
Battery Voltage: 2.79 V
Brady Statistic AP VP Percent: 0 %
Brady Statistic AP VS Percent: 100 %
Brady Statistic AS VP Percent: 0 %
Brady Statistic AS VS Percent: 0 %
Date Time Interrogation Session: 20200312230029
Implantable Lead Implant Date: 20150714
Implantable Lead Implant Date: 20150714
Implantable Lead Location: 753859
Implantable Lead Location: 753860
Implantable Lead Model: 5076
Implantable Lead Model: 5092
Lead Channel Impedance Value: 411 Ohm
Lead Channel Impedance Value: 574 Ohm
Lead Channel Pacing Threshold Amplitude: 0.625 V
Lead Channel Pacing Threshold Pulse Width: 0.4 ms
Lead Channel Setting Pacing Amplitude: 1.5 V
Lead Channel Setting Pacing Amplitude: 2 V
Lead Channel Setting Pacing Pulse Width: 0.4 ms
Lead Channel Setting Sensing Sensitivity: 4 mV
MDC IDC MSMT BATTERY REMAINING LONGEVITY: 128 mo
MDC IDC MSMT LEADCHNL RV PACING THRESHOLD AMPLITUDE: 0.75 V
MDC IDC MSMT LEADCHNL RV PACING THRESHOLD PULSEWIDTH: 0.4 ms
MDC IDC PG IMPLANT DT: 20150714

## 2018-04-26 ENCOUNTER — Other Ambulatory Visit: Payer: Self-pay

## 2018-04-29 ENCOUNTER — Encounter: Payer: Self-pay | Admitting: Cardiology

## 2018-04-29 NOTE — Progress Notes (Signed)
Remote pacemaker transmission.   

## 2018-05-02 DIAGNOSIS — R531 Weakness: Secondary | ICD-10-CM

## 2018-05-02 DIAGNOSIS — J189 Pneumonia, unspecified organism: Secondary | ICD-10-CM

## 2018-05-02 DIAGNOSIS — Z299 Encounter for prophylactic measures, unspecified: Secondary | ICD-10-CM

## 2018-05-02 DIAGNOSIS — F0390 Unspecified dementia without behavioral disturbance: Secondary | ICD-10-CM

## 2018-05-02 DIAGNOSIS — Z9181 History of falling: Secondary | ICD-10-CM

## 2018-05-02 DIAGNOSIS — R911 Solitary pulmonary nodule: Secondary | ICD-10-CM

## 2018-05-02 DIAGNOSIS — E871 Hypo-osmolality and hyponatremia: Secondary | ICD-10-CM

## 2018-05-02 DIAGNOSIS — I4891 Unspecified atrial fibrillation: Secondary | ICD-10-CM

## 2018-05-02 DIAGNOSIS — N179 Acute kidney failure, unspecified: Secondary | ICD-10-CM

## 2018-05-02 DIAGNOSIS — I10 Essential (primary) hypertension: Secondary | ICD-10-CM

## 2018-05-03 DIAGNOSIS — I4891 Unspecified atrial fibrillation: Secondary | ICD-10-CM | POA: Diagnosis not present

## 2018-05-03 DIAGNOSIS — Z9181 History of falling: Secondary | ICD-10-CM | POA: Diagnosis not present

## 2018-05-03 DIAGNOSIS — R531 Weakness: Secondary | ICD-10-CM | POA: Diagnosis not present

## 2018-05-03 DIAGNOSIS — F0390 Unspecified dementia without behavioral disturbance: Secondary | ICD-10-CM | POA: Diagnosis not present

## 2018-05-04 DIAGNOSIS — J189 Pneumonia, unspecified organism: Secondary | ICD-10-CM | POA: Diagnosis not present

## 2018-05-04 DIAGNOSIS — N179 Acute kidney failure, unspecified: Secondary | ICD-10-CM | POA: Diagnosis not present

## 2018-05-04 DIAGNOSIS — I4891 Unspecified atrial fibrillation: Secondary | ICD-10-CM | POA: Diagnosis not present

## 2018-05-04 DIAGNOSIS — E871 Hypo-osmolality and hyponatremia: Secondary | ICD-10-CM | POA: Diagnosis not present

## 2018-05-04 DIAGNOSIS — R1310 Dysphagia, unspecified: Secondary | ICD-10-CM

## 2018-05-05 DIAGNOSIS — N179 Acute kidney failure, unspecified: Secondary | ICD-10-CM | POA: Diagnosis not present

## 2018-05-05 DIAGNOSIS — I4891 Unspecified atrial fibrillation: Secondary | ICD-10-CM | POA: Diagnosis not present

## 2018-05-05 DIAGNOSIS — I629 Nontraumatic intracranial hemorrhage, unspecified: Secondary | ICD-10-CM

## 2018-05-05 DIAGNOSIS — E871 Hypo-osmolality and hyponatremia: Secondary | ICD-10-CM | POA: Diagnosis not present

## 2018-05-05 DIAGNOSIS — J189 Pneumonia, unspecified organism: Secondary | ICD-10-CM | POA: Diagnosis not present

## 2018-05-11 DEATH — deceased

## 2018-07-25 ENCOUNTER — Encounter: Payer: Medicare Other | Admitting: *Deleted

## 2018-07-26 ENCOUNTER — Telehealth: Payer: Self-pay

## 2018-07-26 ENCOUNTER — Telehealth: Payer: Self-pay | Admitting: Cardiology

## 2018-07-26 NOTE — Telephone Encounter (Signed)
Noted  

## 2018-07-26 NOTE — Telephone Encounter (Signed)
Left message for patient to remind of missed remote transmission.  

## 2018-07-26 NOTE — Telephone Encounter (Signed)
New Message:   Daughter called and said patient passed away on June 08, 2018.
